# Patient Record
Sex: Male | Born: 1970 | Race: Black or African American | Hispanic: No | Marital: Single | Smoking: Never smoker
Health system: Southern US, Community
[De-identification: ages and names within clinical notes are randomized; demographics above are authoritative.]

## PROBLEM LIST (undated history)

## (undated) DIAGNOSIS — R55 Syncope and collapse: Principal | ICD-10-CM

---

## 2016-05-05 ENCOUNTER — Emergency Department (HOSPITAL_COMMUNITY): Payer: Self-pay

## 2016-05-05 ENCOUNTER — Emergency Department (HOSPITAL_COMMUNITY)
Admission: EM | Admit: 2016-05-05 | Discharge: 2016-05-06 | Disposition: A | Discharge status: Home / Self Care | Payer: Self-pay | Attending: Emergency Medicine | Admitting: Emergency Medicine

## 2016-05-05 ENCOUNTER — Encounter (HOSPITAL_COMMUNITY): Payer: Self-pay | Admitting: Nurse Practitioner

## 2016-05-05 DIAGNOSIS — J189 Pneumonia, unspecified organism: Secondary | ICD-10-CM | POA: Insufficient documentation

## 2016-05-05 DIAGNOSIS — R0781 Pleurodynia: Secondary | ICD-10-CM

## 2016-05-05 LAB — BASIC METABOLIC PANEL
Anion gap: 7 (ref 5–15)
BUN: 16 mg/dL (ref 6–20)
CALCIUM: 9.1 mg/dL (ref 8.9–10.3)
CO2: 30 mmol/L (ref 22–32)
CREATININE: 0.88 mg/dL (ref 0.61–1.24)
Chloride: 103 mmol/L (ref 101–111)
Glucose, Bld: 95 mg/dL (ref 65–99)
Potassium: 4.5 mmol/L (ref 3.5–5.1)
Sodium: 140 mmol/L (ref 135–145)

## 2016-05-05 LAB — CBC
HCT: 37.3 % — ABNORMAL LOW (ref 39.0–52.0)
Hemoglobin: 12.6 g/dL — ABNORMAL LOW (ref 13.0–17.0)
MCH: 31 pg (ref 26.0–34.0)
MCHC: 33.8 g/dL (ref 30.0–36.0)
MCV: 91.9 fL (ref 78.0–100.0)
PLATELETS: 199 10*3/uL (ref 150–400)
RBC: 4.06 MIL/uL — AB (ref 4.22–5.81)
RDW: 13.5 % (ref 11.5–15.5)
WBC: 3.5 10*3/uL — ABNORMAL LOW (ref 4.0–10.5)

## 2016-05-05 LAB — I-STAT TROPONIN, ED: TROPONIN I, POC: 0.01 ng/mL (ref 0.00–0.08)

## 2016-05-05 LAB — D-DIMER, QUANTITATIVE: D-Dimer, Quant: 0.53 ug/mL-FEU — ABNORMAL HIGH (ref 0.00–0.50)

## 2016-05-05 MED ORDER — OXYCODONE-ACETAMINOPHEN 5-325 MG PO TABS
1.0000 | ORAL_TABLET | Freq: Once | ORAL | Status: AC
  Administered 2016-05-05: 1 via ORAL
  Filled 2016-05-05: qty 1

## 2016-05-05 NOTE — ED Provider Notes (Signed)
[facility]-EMERGENCY DEPT Provider Note   CSN: [id number] Arrival date & time: 05/05/16  2019   By signing my name below, I, Christel MormonMatthew Jamison, attest that this documentation has been prepared under the direction and in the presence of Elpidio AnisShari Natalee Tomkiewicz, PA-C. Electronically Signed: Christel MormonMatthew Jamison, Scribe. 05/05/2016. 9:20 PM.   History   Chief Complaint Chief Complaint  Patient presents with  . Chest Pain    The history is provided by the patient. No language interpreter was used.   HPI Comments:  Tyler Leon is a 45 y.o. male who presents to the Emergency Department complaining of intermittent, nonradiating, left chest pain x1 week. Pt states that pain has been more constant for the last few days, prompting ED evaluation. Pt reports associated SOB, without cough or fever. The pain is sometimes worse with movement and deep breath.  He has mild abdominal pain that is not consistently present when the chest pain occurs. Pt reports PSHx of cardiac catheterization "years ago" with no stents placed. He believes his brother had "cardiac issues" but cannot say what he thinks they were.  Pt has taken tylenol with no relief. Pt denies cough, fever, nausea, vomiting, leg swelling.   History reviewed. No pertinent past medical history.  There are no active problems to display for this patient.   History reviewed. No pertinent surgical history.     Home Medications    Prior to Admission medications   Not on File    Family History History reviewed. No pertinent family history.  Social History Social History  Substance Use Topics  . Smoking status: Never Smoker  . Smokeless tobacco: Never Used  . Alcohol use Yes     Allergies   Dilantin [phenytoin]   Review of Systems Review of Systems  Constitutional: Negative for activity change, diaphoresis and fever.  HENT: Negative.   Respiratory: Positive for shortness of breath. Negative for cough.   Cardiovascular: Positive for chest  pain. Negative for leg swelling.  Gastrointestinal: Positive for abdominal pain. Negative for nausea and vomiting.  Musculoskeletal: Negative.   Skin: Negative.   Neurological: Negative for weakness.     Physical Exam Updated Vital Signs BP 130/89 (BP Location: Left Arm)   Pulse 66   Temp 98.3 F (36.8 C) (Oral)   Resp 20   SpO2 100%   Physical Exam  Constitutional: He appears well-developed and well-nourished. No distress.  HENT:  Head: Normocephalic and atraumatic.  Eyes: Conjunctivae are normal.  Cardiovascular: Normal rate.   No carotid bruits ; RRR, no murmurs,  Pulmonary/Chest: Effort normal and breath sounds normal. He exhibits no tenderness.  Abdominal: Soft. He exhibits no distension. There is no tenderness.  Musculoskeletal:  No edema  Neurological: He is alert.  Skin: Skin is warm and dry.  Psychiatric: He has a normal mood and affect.  Nursing note and vitals reviewed.    ED Treatments / Results  DIAGNOSTIC STUDIES:  Oxygen Saturation is 100% on RA, normal by my interpretation.    COORDINATION OF CARE:  9:20 PM Discussed treatment plan with pt at bedside and pt agreed to plan.   Labs (all labs ordered are listed, but only abnormal results are displayed) Labs Reviewed  BASIC METABOLIC PANEL  CBC  I-STAT TROPOININ, ED   Results for orders placed or performed during the hospital encounter of 05/05/16  Basic metabolic panel  Result Value Ref Range   Sodium 140 135 - 145 mmol/L   Potassium 4.5 3.5 - 5.1 mmol/L  Chloride 103 101 - 111 mmol/L   CO2 30 22 - 32 mmol/L   Glucose, Bld 95 65 - 99 mg/dL   BUN 16 6 - 20 mg/dL   Creatinine, Ser 1.61 0.61 - 1.24 mg/dL   Calcium 9.1 8.9 - 09.6 mg/dL   GFR calc non Af Amer >60 >60 mL/min   GFR calc Af Amer >60 >60 mL/min   Anion gap 7 5 - 15  CBC  Result Value Ref Range   WBC 3.5 (L) 4.0 - 10.5 K/uL   RBC 4.06 (L) 4.22 - 5.81 MIL/uL   Hemoglobin 12.6 (L) 13.0 - 17.0 g/dL   HCT 04.5 (L) 40.9 - 81.1 %     MCV 91.9 78.0 - 100.0 fL   MCH 31.0 26.0 - 34.0 pg   MCHC 33.8 30.0 - 36.0 g/dL   RDW 91.4 78.2 - 95.6 %   Platelets 199 150 - 400 K/uL  D-dimer, quantitative (not at [facility])  Result Value Ref Range   D-Dimer, Quant 0.53 (H) 0.00 - 0.50 ug/mL-FEU  I-stat troponin, ED  Result Value Ref Range   Troponin i, poc 0.01 0.00 - 0.08 ng/mL   Comment 3           Dg Chest 2 View  Result Date: 05/05/2016 CLINICAL DATA:  45 year old male with left-sided chest pain. EXAM: CHEST  2 VIEW COMPARISON:  None. FINDINGS: The heart size and mediastinal contours are within normal limits. Both lungs are clear. The visualized skeletal structures are unremarkable. IMPRESSION: No active cardiopulmonary disease. Electronically Signed   By: Elgie Collard M.D.   On: 05/05/2016 22:15   Ct Angio Chest Pe W/cm &/or Wo Cm  Result Date: 05/06/2016 CLINICAL DATA:  Acute onset of intermittent left-sided chest pain. Shortness of breath and mildly elevated D-dimer. Initial encounter. EXAM: CT ANGIOGRAPHY CHEST WITH CONTRAST TECHNIQUE: Multidetector CT imaging of the chest was performed using the standard protocol during bolus administration of intravenous contrast. Multiplanar CT image reconstructions and MIPs were obtained to evaluate the vascular anatomy. CONTRAST:  100 mL of Isovue 370 IV contrast COMPARISON:  Chest radiograph performed 05/05/2016 FINDINGS: Cardiovascular:  There is no evidence of pulmonary embolus. The heart is unremarkable in appearance. The thoracic aorta is grossly unremarkable. The great vessels are not well assessed due to the adjacent contrast bolus. Mediastinum/Nodes: The mediastinum is unremarkable in appearance. No mediastinal lymphadenopathy is seen. No pericardial effusion is identified. The thyroid gland is unremarkable. No axillary lymphadenopathy is appreciated. Lungs/Pleura: Mild left lower lobe opacity may reflect atelectasis or mild infection. No pleural effusion or pneumothorax is seen. No  masses are identified. Upper Abdomen: The visualized portions of the liver and spleen are grossly unremarkable. The visualized portions of the pancreas, adrenal glands and kidneys are within normal limits. Musculoskeletal: No acute osseous abnormalities are identified. The visualized musculature is unremarkable in appearance. Review of the MIP images confirms the above findings. IMPRESSION: 1. No evidence of pulmonary embolus. 2. Mild left lower lobe airspace opacity may reflect atelectasis or mild infection. Electronically Signed   By: Roanna Raider M.D.   On: 05/06/2016 00:40    EKG  EKG Interpretation None       Radiology No results found.  Procedures Procedures (including critical care time)  Medications Ordered in ED Medications - No data to display   Initial Impression / Assessment and Plan / ED Course  I have reviewed the triage vital signs and the nursing notes.  Pertinent labs & imaging results  that were available during my care of the patient were reviewed by me and considered in my medical decision making (see chart for details).  Clinical Course    Patient presents with left chest pain x 1 week, progressively worsening and associated with pleuritic type discomfort. He complains of some shortness of breath. Pain is not exertional. No fever.   The patient is seen by Dr. Madilyn Hook. No hypoxia or tachycardia, but d-dimer obtained secondary to pleuritic component of chest pain, and is found to be mildly elevated. CT angio is negative for PE but shows ?infection on left. Given pain is left sided, he has pleuritic pain and SOB, with other tests negative, will treat for early CAP with Zithromax. He is felt stable for discharge home.   Final Clinical Impressions(s) / ED Diagnoses   Final diagnoses:  None   1. Left chest pain 2. Early CAP  New Prescriptions New Prescriptions   No medications on file  I personally performed the services described in this documentation, which  was scribed in my presence. The recorded information has been reviewed and is accurate.      Elpidio Anis, PA-C 05/06/16 1610    Tilden Fossa, MD 05/07/16 [phone]

## 2016-05-05 NOTE — ED Notes (Signed)
No respiratory or acute distress noted alert and oriented x 3 call light in reach no reaction to medication noted. 

## 2016-05-05 NOTE — ED Triage Notes (Signed)
Pt c/o of generalized chest pain, onset a week ago, denies any associated symptoms.

## 2016-05-05 NOTE — ED Notes (Signed)
No respiratory or acute distress noted alert and oriented x 3 call light in reach. 

## 2016-05-06 ENCOUNTER — Encounter (HOSPITAL_COMMUNITY): Payer: Self-pay

## 2016-05-06 MED ORDER — AZITHROMYCIN 250 MG PO TABS
500.0000 mg | ORAL_TABLET | Freq: Once | ORAL | Status: AC
  Administered 2016-05-06: 500 mg via ORAL
  Filled 2016-05-06: qty 2

## 2016-05-06 MED ORDER — AZITHROMYCIN 250 MG PO TABS: 250.0000 mg | ORAL_TABLET | Freq: Every day | ORAL | 0 refills | Status: DC

## 2016-05-06 MED ORDER — KETOROLAC TROMETHAMINE 30 MG/ML IJ SOLN
30.0000 mg | Freq: Once | INTRAMUSCULAR | Status: AC
  Administered 2016-05-06: 30 mg via INTRAVENOUS
  Filled 2016-05-06: qty 1

## 2016-05-06 MED ORDER — NAPROXEN 500 MG PO TABS: 500.0000 mg | ORAL_TABLET | Freq: Two times a day (BID) | ORAL | 0 refills | Status: AC

## 2016-05-06 MED ORDER — IOPAMIDOL (ISOVUE-370) INJECTION 76%
100.0000 mL | Freq: Once | INTRAVENOUS | Status: AC | PRN
  Administered 2016-05-06: 100 mL via INTRAVENOUS

## 2016-05-06 NOTE — ED Notes (Signed)
No respiratory or acute distress noted alert and oriented x 3 call light in reach no reaction to medication noted. 

## 2016-05-15 ENCOUNTER — Ambulatory Visit: Payer: Self-pay | Attending: Critical Care Medicine | Admitting: Critical Care Medicine

## 2016-05-15 ENCOUNTER — Encounter: Payer: Self-pay | Admitting: Critical Care Medicine

## 2016-05-15 VITALS — BP 118/78 | HR 99 | Temp 98.3°F | Resp 18 | Ht 70.0 in | Wt 166.2 lb

## 2016-05-15 DIAGNOSIS — M940 Chondrocostal junction syndrome [Tietze]: Secondary | ICD-10-CM | POA: Insufficient documentation

## 2016-05-15 DIAGNOSIS — Z888 Allergy status to other drugs, medicaments and biological substances status: Secondary | ICD-10-CM | POA: Insufficient documentation

## 2016-05-15 NOTE — Progress Notes (Signed)
Subjective:    Patient ID: Tyler Leon, male    DOB: 1970-09-12, 45 y.o.   MRN: [id number]  Tyler Leon is a 45 y.o. male who presents to the Emergency Department complaining of intermittent, nonradiating, left chest pain x1 week. Pt states that pain has been more constant for the last few days, prompting ED evaluation  Pt with LLL CAP Rx Azithromycin   Since d/c from ED:  Pt noting more chest pain 9/10.  Pain is pressure like.  Non radiating.  No GERD symptoms     Pneumonia  He complains of chest tightness and shortness of breath. There is no cough, difficulty breathing, frequent throat clearing, hemoptysis, sputum production or wheezing. This is a chronic (chest pain chronic and recurrent for years) problem. The current episode started more than 1 year ago. The problem occurs constantly. The problem has been unchanged. Associated symptoms include chest pain. Pertinent negatives include no appetite change, dyspnea on exertion, ear congestion, ear pain, fever, headaches, heartburn, malaise/fatigue, myalgias, nasal congestion, orthopnea, PND, postnasal drip, rhinorrhea, sneezing, sore throat, trouble swallowing or weight loss. There is no history of pneumonia.    No past medical history on file.   No family history on file.   Social History   Social History  . Marital status: Single    Spouse name: N/A  . Number of children: N/A  . Years of education: N/A   Occupational History  . Not on file.   Social History Main Topics  . Smoking status: Never Smoker  . Smokeless tobacco: Never Used  . Alcohol use Yes  . Drug use: No  . Sexual activity: Not on file   Other Topics Concern  . Not on file   Social History Narrative  . No narrative on file     Allergies  Allergen Reactions  . Dilantin [Phenytoin]      Outpatient Medications Prior to Visit  Medication Sig Dispense Refill  . naproxen (NAPROSYN) 500 MG tablet Take 1 tablet (500 mg total) by mouth 2 (two)  times daily. (Patient not taking: Reported on 05/15/2016) 30 tablet 0  . azithromycin (ZITHROMAX Z-PAK) 250 MG tablet Take 1 tablet (250 mg total) by mouth daily. (Patient not taking: Reported on 05/15/2016) 4 tablet 0   No facility-administered medications prior to visit.      Review of Systems  Constitutional: Negative for appetite change, fatigue, fever, malaise/fatigue and weight loss.  HENT: Negative for ear pain, postnasal drip, rhinorrhea, sneezing, sore throat, trouble swallowing and voice change.   Respiratory: Positive for shortness of breath. Negative for cough, hemoptysis, sputum production and wheezing.   Cardiovascular: Positive for chest pain. Negative for dyspnea on exertion and PND.  Gastrointestinal: Negative.  Negative for heartburn.  Musculoskeletal: Negative for myalgias.  Neurological: Negative for headaches.       Objective:   Physical Exam Vitals:   05/15/16 1041  BP: 118/78  Pulse: 99  Resp: 18  Temp: 98.3 F (36.8 C)  TempSrc: Oral  SpO2: 98%  Weight: 166 lb 3.2 oz (75.4 kg)  Height: 5\' 10"  (1.778 m)    Gen: Pleasant, well-nourished, in no distress,  normal affect  ENT: No lesions,  mouth clear,  oropharynx clear, no postnasal drip  Neck: No JVD, no TMG, no carotid bruits  Lungs: No use of accessory muscles, no dullness to percussion, clear without rales or rhonchi Tenderness Left lateral sternum at costochondral junction  Cardiovascular: RRR, heart sounds normal, no murmur or  gallops, no peripheral edema  Abdomen: soft and NT, no HSM,  BS normal  Musculoskeletal: No deformities, no cyanosis or clubbing  Neuro: alert, non focal  Skin: Warm, no lesions or rashes  No results found. CT Chest 9/25: IMPRESSION: 1. No evidence of pulmonary embolus. 2. Mild left lower lobe airspace opacity may reflect atelectasis or mild infection.  ECG was normal     Assessment & Plan:  I personally reviewed [facility] and [facility] as  well as any outside material available during this office visit and agree with the  radiology impressions.   Costochondritis Costochondritis No evidence for PNA No further ABX needed Use of completion of naprosyn and f/u with prn ibuprofen encouraged No other Rx or work up indicated F/u with pcp   Tyler SeniorStephen was seen today for pneumonia.  Diagnoses and all orders for this visit:  Costochondritis

## 2016-05-15 NOTE — Patient Instructions (Addendum)
Resume Naprosyn 500mg  twice daily until gone, then can use ibuprofen 1-2 tablets, 3-4 times per day You do not have pneumonia You have inflammation , costochondritis  Return as needed  We will obtain a primary care MD for you  Costochondritis Costochondritis, sometimes called Tietze syndrome, is a swelling and irritation (inflammation) of the tissue (cartilage) that connects your ribs with your breastbone (sternum). It causes pain in the chest and rib area. Costochondritis usually goes away on its own over time. It can take up to 6 weeks or longer to get better, especially if you are unable to limit your activities. CAUSES  Some cases of costochondritis have no known cause. Possible causes include:  Injury (trauma).  Exercise or activity such as lifting.  Severe coughing. SIGNS AND SYMPTOMS  Pain and tenderness in the chest and rib area.  Pain that gets worse when coughing or taking deep breaths.  Pain that gets worse with specific movements. DIAGNOSIS  Your health care provider will do a physical exam and ask about your symptoms. Chest X-rays or other tests may be done to rule out other problems. TREATMENT  Costochondritis usually goes away on its own over time. Your health care provider may prescribe medicine to help relieve pain. HOME CARE INSTRUCTIONS   Avoid exhausting physical activity. Try not to strain your ribs during normal activity. This would include any activities using chest, abdominal, and side muscles, especially if heavy weights are used.  Apply ice to the affected area for the first 2 days after the pain begins.  Put ice in a plastic bag.  Place a towel between your skin and the bag.  Leave the ice on for 20 minutes, 2-3 times a day.  Only take over-the-counter or prescription medicines as directed by your health care provider. SEEK MEDICAL CARE IF:  You have redness or swelling at the rib joints. These are signs of infection.  Your pain does not go away  despite rest or medicine. SEEK IMMEDIATE MEDICAL CARE IF:   Your pain increases or you are very uncomfortable.  You have shortness of breath or difficulty breathing.  You cough up blood.  You have worse chest pains, sweating, or vomiting.  You have a fever or persistent symptoms for more than 2-3 days.  You have a fever and your symptoms suddenly get worse. MAKE SURE YOU:   Understand these instructions.  Will watch your condition.  Will get help right away if you are not doing well or get worse.   This information is not intended to replace advice given to you by your health care provider. Make sure you discuss any questions you have with your health care provider.   Document Released: 05/08/2005 Document Revised: 05/19/2013 Document Reviewed: 03/02/2013 Elsevier Interactive Patient Education [employer]2016 [employer] Inc[employer]

## 2016-05-15 NOTE — Progress Notes (Signed)
Patient is here for HFU for pneumonia  Patient complains of chest pain being present since sat. Patient states pain is described as tightening and increases at night.  Patient has taken medication today and patient has eaten today.

## 2016-05-16 NOTE — Assessment & Plan Note (Signed)
Costochondritis No evidence for PNA No further ABX needed Use of completion of naprosyn and f/u with prn ibuprofen encouraged No other Rx or work up indicated F/u with pcp

## 2017-10-22 IMAGING — CT CT ANGIO CHEST
2 of 6 series · 19 of 36 positions shown · IV contrast (ISOVUE 370)
Comparison: Chest radiograph performed 05/05/2016

CLINICAL DATA: Acute onset of intermittent left-sided chest pain.
Shortness of breath and mildly elevated D-dimer. Initial encounter.

EXAM:
CT ANGIOGRAPHY CHEST WITH CONTRAST
TECHNIQUE: Multidetector CT imaging of the chest was performed using the
standard protocol during bolus administration of intravenous
contrast. Multiplanar CT image reconstructions and MIPs were
obtained to evaluate the vascular anatomy.
CONTRAST:  100 mL of Isovue 370 IV contrast

[Series 5: thins for pacs · axial · 0.71mm/px · z∈[+1498,+1783]mm · 18 of 317 slices shown]
[im 16/317  lung]
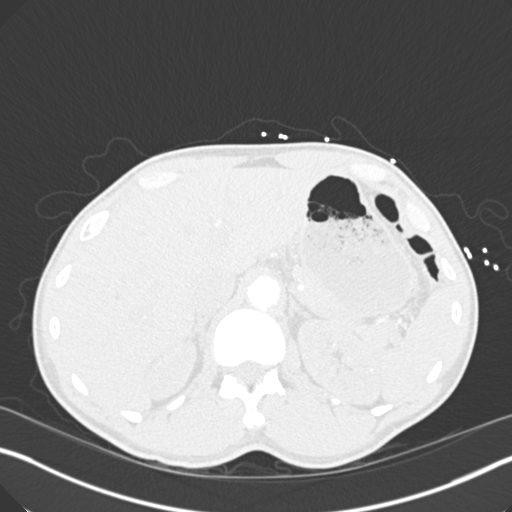
[im 32/317  mediastinal]
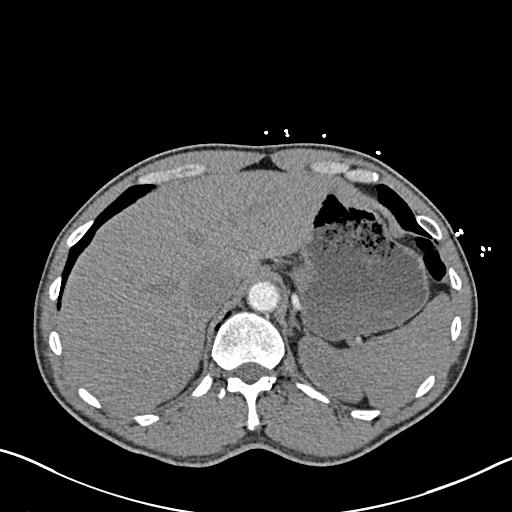
[im 48/317  lung]
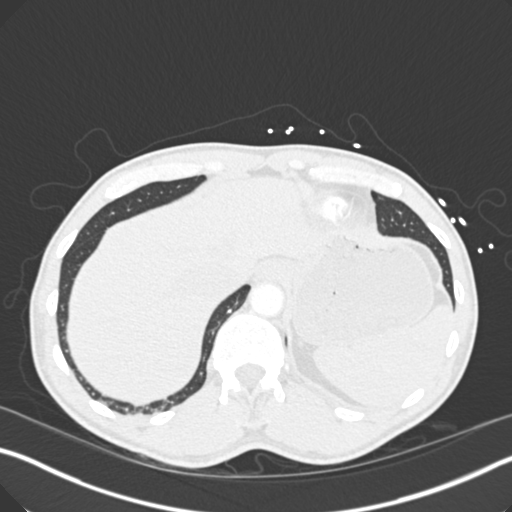
[im 64/317  mediastinal]
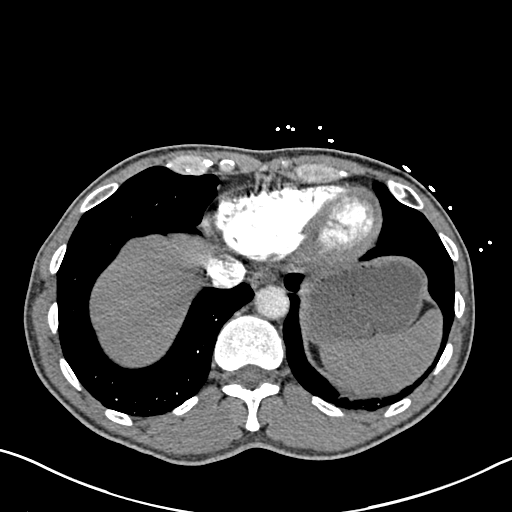
[im 80/317  lung]
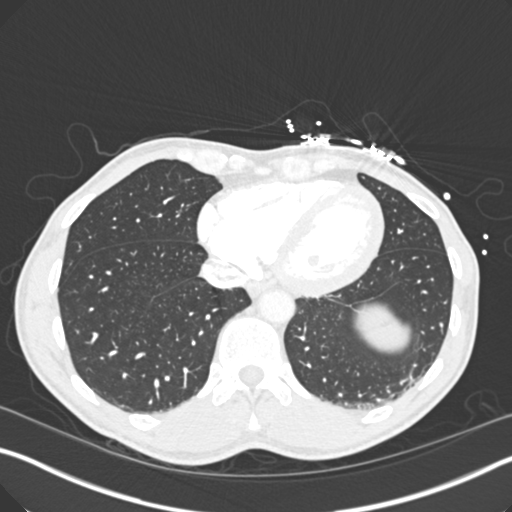
[im 95/317  mediastinal]
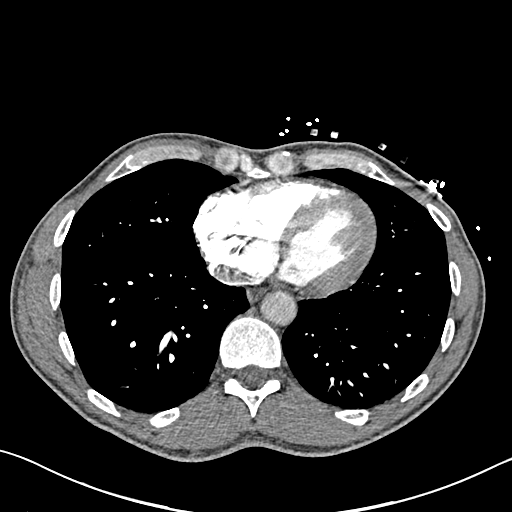
[im 111/317  lung]
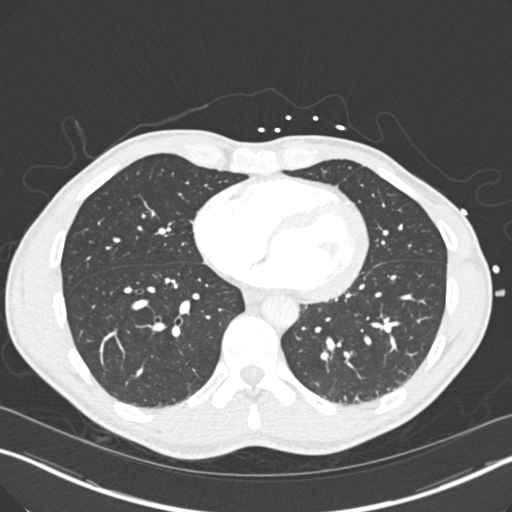
[im 127/317  mediastinal]
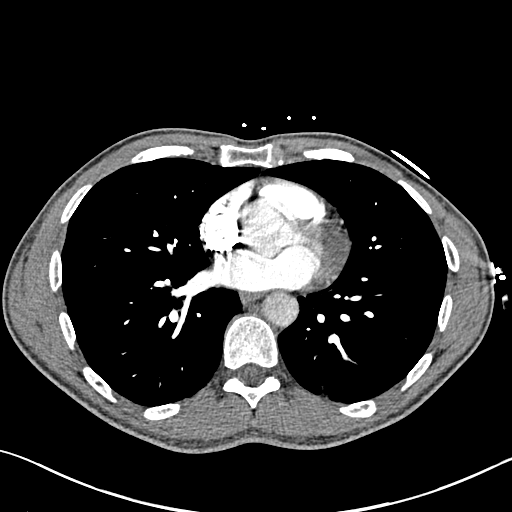
[im 143/317  lung]
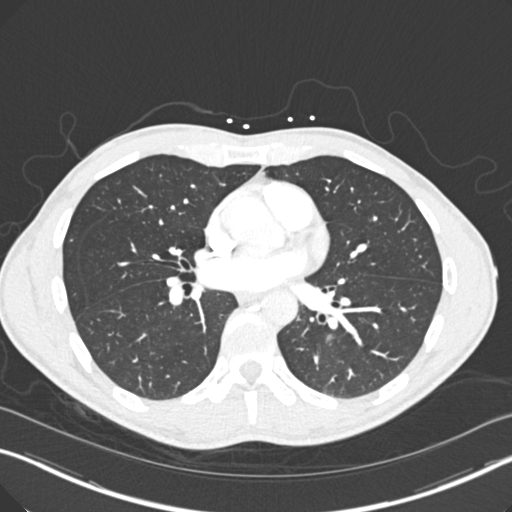
[im 174/317  mediastinal]
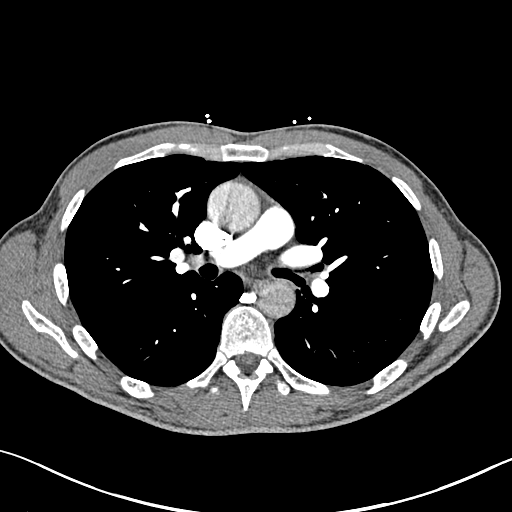
[im 190/317  lung]
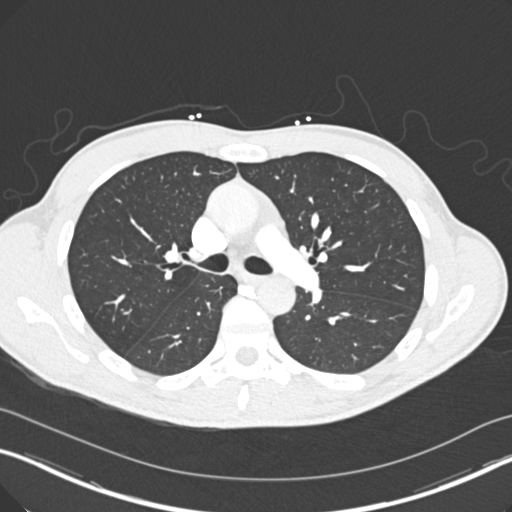
[im 206/317  mediastinal]
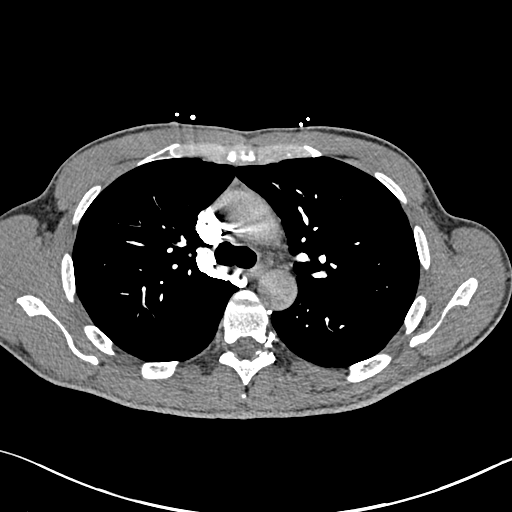
[im 222/317  lung]
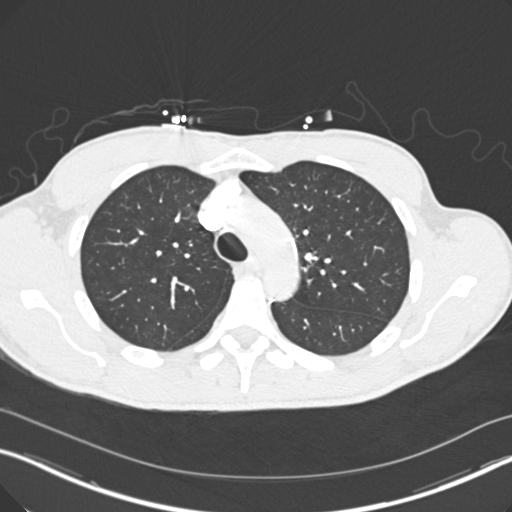
[im 238/317  mediastinal]
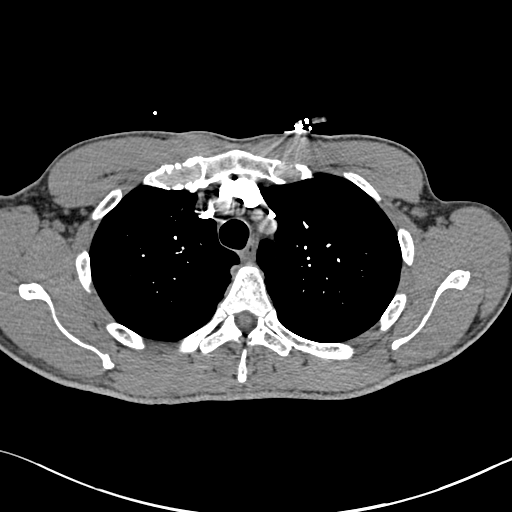
[im 253/317  lung]
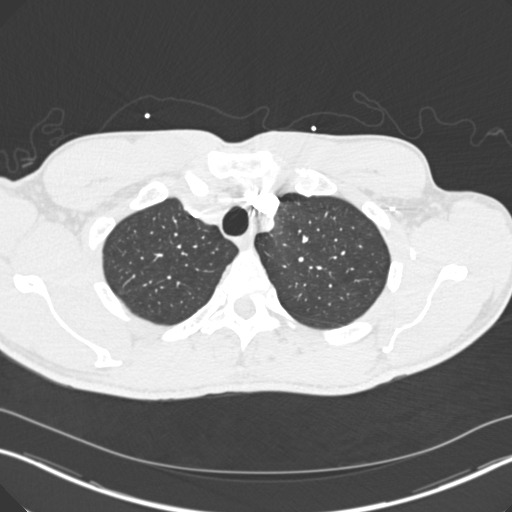
[im 269/317  mediastinal]
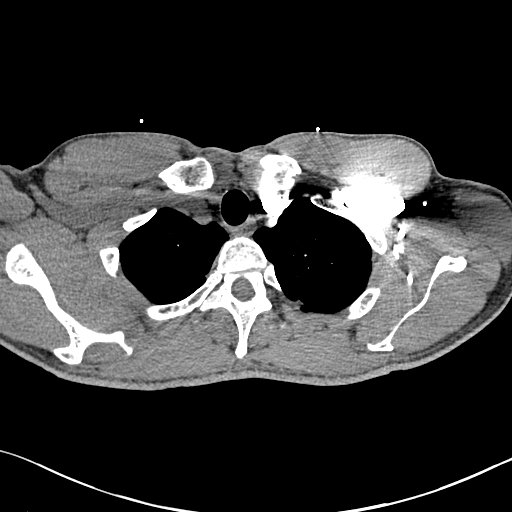
[im 285/317  lung]
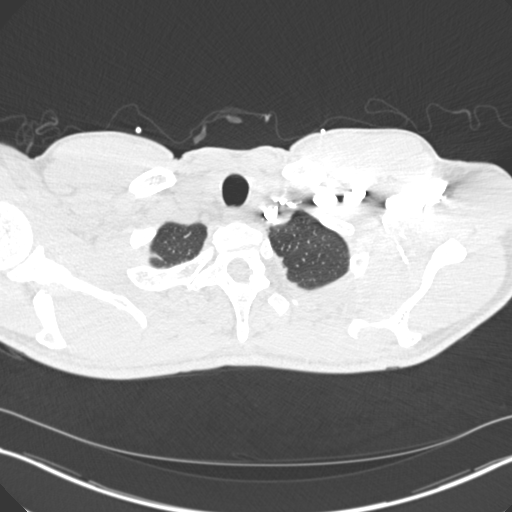
[im 301/317  mediastinal]
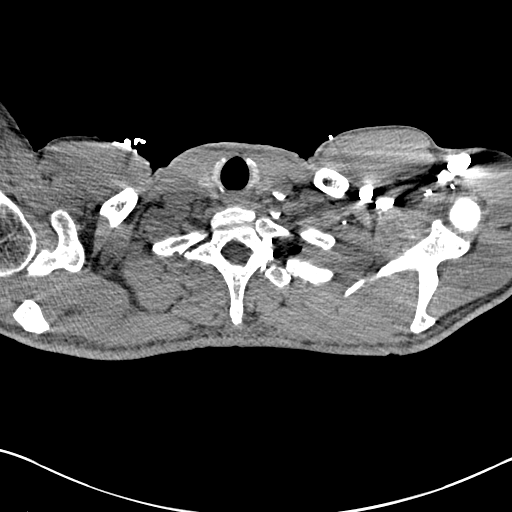

[Series 7: coronal mpr · coronal · 0.62mm/px · 1 of 108 slices shown]
[im 54/108  mediastinal]
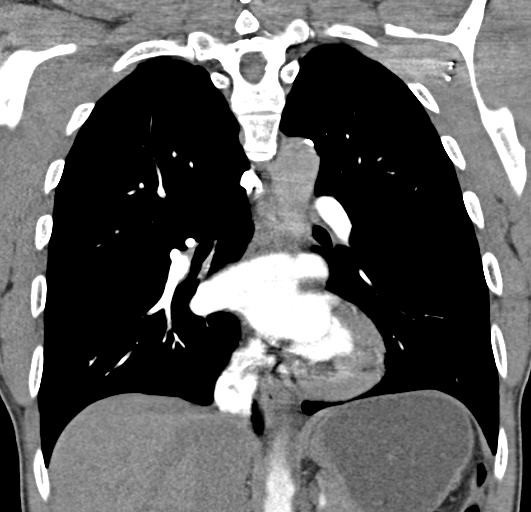

[19 of 36 positions shown; findings below may reference images not displayed]

FINDINGS: Cardiovascular:  There is no evidence of pulmonary embolus.

The heart is unremarkable in appearance. The thoracic aorta is
grossly unremarkable. The great vessels are not well assessed due to
the adjacent contrast bolus.

Mediastinum/Nodes: The mediastinum is unremarkable in appearance. No
mediastinal lymphadenopathy is seen. No pericardial effusion is
identified. The thyroid gland is unremarkable. No axillary
lymphadenopathy is appreciated.

Lungs/Pleura: Mild left lower lobe opacity may reflect atelectasis
or mild infection. No pleural effusion or pneumothorax is seen. No
masses are identified.

Upper Abdomen: The visualized portions of the liver and spleen are
grossly unremarkable. The visualized portions of the pancreas,
adrenal glands and kidneys are within normal limits.

Musculoskeletal: No acute osseous abnormalities are identified. The
visualized musculature is unremarkable in appearance.

Review of the MIP images confirms the above findings.
IMPRESSION: 1. No evidence of pulmonary embolus.
2. Mild left lower lobe airspace opacity may reflect atelectasis or
mild infection.

## 2021-07-13 NOTE — ED Provider Notes (Signed)
[facility] - EMERGENCY DEPARTMENT  EMERGENCY DEPARTMENT ENCOUNTER        PATIENT NAME: Luis Santiago  MRN: [id number]  DOB: [birth year]  DATE OF EVALUATION: 07/13/2021  ED PROVIDER: Aquilla Hacker, PA  PCP: No Pcp      CHIEF COMPLAINT       Chief Complaint   Patient presents with   . Neck Pain       HISTORY OF PRESENT ILLNESS   (Location/Symptom, Timing/Onset, Context/Setting, Quality, Duration, Modifying Factors,Severity)  Note limiting factors.     Luis Santiago is a 50 y.o. male with complaints of neck pain.  States that the bilateral base of the posterior neck worse on the left side.  Denies any numbness, tingling or weakness in his arms or legs.  He denies any injury or trauma.  He does drive a forklift for work.  He states that it started about 4 days ago and has increased.  He has been using warm towels on the area with no improvement.  He is also tried Motrin at home.    Nursing Notes were all reviewed and agreed with or any disagreements were addressed in the HPI.    REVIEW OF SYSTEMS    (2-9 systems for level 4, 10 or more for level 5)     GENERAL:     SKIN:    EYES:  No fevers, No chills, No recent weight change    No rash/lesions/wounds    No vision changes, No redness     ENMT:      No sore throat, No congestion, No ear pain   CARDIAC:    RESPIRATORY:    GI:       MSK:    NEURO:      PSYCH:   No chest pain, No palpitations     No shortness of breath, No cough, No dyspnea on exertion    No vomiting, no nausea, no diarrhea, no constipation, no abdominal pain    No joint pain    No headache, no change in mental status, No numbness, No tingling, No weakness    No SI/HI, No hallucinations      Positives and Pertinent negatives as per HPI.  Except as noted above in the ROS, all other systems were reviewed and negative or noncontributory.       PAST MEDICAL HISTORY   No past medical history on file.      SURGICAL HISTORY     No past surgical history on file.      CURRENT MEDICATIONS        Discharge Medication List as of 07/13/2021 11:36 PM          ALLERGIES     Dilantin [phenytoin sodium extended]    FAMILY HISTORY     No family history on file.     SOCIAL HISTORY       Social History     Socioeconomic History   . Marital status: Single     Spouse name: Not on file   . Number of children: Not on file   . Years of education: Not on file   . Highest education level: Not on file   Occupational History   . Not on file   Tobacco Use   . Smoking status: Never   . Smokeless tobacco: Never   Substance and Sexual Activity   . Alcohol use: Yes   . Drug use: Never   . Sexual activity: Not on file  Other Topics Concern   . Not on file   Social History Narrative   . Not on file     Social Determinants of Health     Financial Resource Strain: Not on file   Food Insecurity: Not on file   Transportation Needs: Not on file   Physical Activity: Not on file   Stress: Not on file   Social Connections: Not on file   Intimate Partner Violence: Not on file   Housing Stability: Not on file         PHYSICAL EXAM    (up to 7 for level 4, 8 or more for level 5)     Vitals:    07/13/21 2334   BP: 160/90   BP Location: Right arm   Patient Position: Sitting   Pulse: 75   Resp: 18   Temp: 97.8 F (36.6 C)   TempSrc: Oral   SpO2: 100%   Weight: 81.6 kg (180 lb)   Height: 177.8 cm (5\' 10" )         GENERAL:   SKIN: Alert, cooperative, no acute distress, non-toxic  No rashes, lesions, wounds to exposed skin.  Cap refill normal. Warm, dry         HEAD:  Normocephalic, without obvious abnormality, atraumatic   EYES:    PERRLA, conjunctiva/corneas clear, EOMI.  Sclera anicteric   ENT:   Mucous membranes moist.  No stridor.  Tolerating secretions with no difficulty swallowing.         LUNGS:   Lungs clear to auscultation bilaterally with no rhonchi, wheezes or crackles.  No increased work of breathing.  Symmetric expansion.  Equal lung sounds bilaterally.   CHEST WALL:  Nontender, no deformity, symmetric   HEART:  Regular rate and  rhythm, normal S1-S2.  No murmurs gallops or rubs.  No edema.  Peripheral pulses intact and equal.   ABDOMEN:   Soft, nontender, nondistended.  No evidence of mass, guarding, rigidity or rebound.  No organomegaly.  Normoactive bowel sounds in all quadrants.   EXTREMITIES: Full range of motion of extremities with no obvious swelling, deformity or tenderness.   BACK:  No midline tenderness along cervical or thoracic spine.  No step-off or deformity.  Patient is tender at the base of bilateral trapezius muscles.  No pain in the anterior neck.  Patient is able to flex, extend, laterally flex and rotate at the neck states some movements do exacerbate pain and make it feel stiff.  He is able to shrug her shoulders, squeeze shoulder blades together and raise his arms above his head without significant difficulty.  He has normal and equal strength, sensation and reflexes of bilateral upper extremities.       NEUROLOGIC:  PSYCH:    Alert and oriented X 4.  CN 2-12 intact.  No focal deficits.  Cooperative, appropriate mood and affect.      MEDICAL DECISION-MAKING     Vitals:    07/13/21 2334   BP: 160/90   BP Location: Right arm   Patient Position: Sitting   Pulse: 75   Resp: 18   Temp: 97.8 F (36.6 C)   TempSrc: Oral   SpO2: 100%   Weight: 81.6 kg (180 lb)   Height: 177.8 cm (5\' 10" )       Patient was given the following medications:  Medications   ketorolac (TORADOL) injection 30 mg (30 mg intramuscular Given 07/13/21 2340)   dexamethasone (DECADRON) 10 mg/mL oral solution 10 mg (10  mg oral Given 07/13/21 2340)       Patient here with complaints of neck pain.  Describes it is slightly worse on the left as compared to the right but has tenderness in bilateral trapezius muscles at the more inferior aspect.  He has intact range of motion and is neurologically intact as well.  He has no midline tenderness I do not feel imaging is indicated at this time.  Patient was given Decadron and Toradol.  He is discharged home with  prescription for Flexeril and encouraged use Motrin and Tylenol at home as needed for pain control.  Encourage stretching, massage and heat as well.  If not improving over the week with this and medications, he is to follow-up with PCP.  He is otherwise to return to ED if worsening.  He states understanding of plan, is agreeable and is discharged home in stable condition.    The patient and / or the family were informed of the results of any tests, a time was given to answer questions.    FINAL IMPRESSION      1. Trapezius muscle strain, unspecified laterality, initial encounter        DISPOSITION / PLAN   Discharge home    PATIENT REFERRED TO:  PCP    In 3 days  As needed      DISCHARGE MEDICATIONS:  Discharge Medication List as of 07/13/2021 11:36 PM      START taking these medications    Details   cyclobenzaprine (FLEXERIL) 10 mg tablet Take 1 tablet (10 mg total) by mouth 3 (three) times a day as needed for muscle spasms for up to 3 days., Starting Fri 07/13/2021, Until Mon 07/16/2021 at 2359, Normal             DISCONTINUED MEDICATIONS:  Discharge Medication List as of 07/13/2021 11:36 PM            Aquilla Hacker, PA (electronically signed)      (Please note that portions of this note were completed with a voice recognition program.  Efforts were made to edit the dictations but occasionally words are mis-transcribed.)    This care is provided during extreme circumstances caused in large part by the current COVID-19 pandemic.  This has placed heavy strains on healthcare resources, including ED and hospital operations.  We continue under a declaration of a [employer] Emergency at this time.  As this pandemic continues to evolve, the Hospital, Physicians, Advanced Practice Providers and other hospital staff strive to respond fluidly, remain operational and provide care relative to available resources and information.  Further, the impact of COVID-19 on all aspects of emergency and hospital care,  including the impact to patients seeking care for reasons other than COVID-19, is unavoidable during this National Emergency.

## 2021-08-04 DIAGNOSIS — S161XXA Strain of muscle, fascia and tendon at neck level, initial encounter: Secondary | ICD-10-CM

## 2021-08-04 NOTE — Discharge Instructions (Addendum)
You were evaluated in the emergency department today for left-sided neck pain    Physical exam is consistent with muscle strain and spasm     you were given a shot of Toradol here in the emergency department    I have written you prescription for Robaxin.  This is a muscle relaxer.  Take caution while taking this muscle relaxer as sometimes it can cause people to be groggy, foggy, sleepy, increased risk of falls.  Do not drink alcohol on this medication or take other sedatives on this medication    I have written your prescription for Mobic.  This is a NSAID.  Do not take ibuprofen, Motrin, Aleve while taking this medication.  Recommend taking this medication with    Contact your primary care provider to schedule follow-up within the next 1 to 2 weeks.  Recommend that you discuss with your primary care provider possible need for physical therapy as this neck pain has been persistent    Return to this emergency department or another emergency department if you have numbness or weakness in your upper extremities, chest pain, shortness of breath, signs or symptoms of stroke is listed in your discharge paperwork

## 2021-08-04 NOTE — ED Triage Notes (Signed)
Pt arrives ambulatory to ED. Pt c/o LEFT sided neck pain. Pt states was seen 2 weeks ago for same and diagnosed with a "muscle  strain". Pt states no relief with OTC medication, or stretches he was told to do.   Pt reports pain is intermittent in nature but sharp when occurring. Mainly occurring when pt moves head side to side.

## 2021-08-04 NOTE — ED Notes (Signed)
I have reviewed discharge instructions with the patient.  The patient verbalized understanding.    Patient left ED via Discharge Method: ambulatory to Home     Opportunity for questions and clarification provided. Pt BP 167 systolic, pt reports hx of HTN and was prescribed medication for it but is now noncompliant. RN encouraged pt to follow up w/ primary care      Patient given 2 scripts.          Dorise Hiss Zailah Zagami, RN  08/04/21 [phone]

## 2021-08-04 NOTE — ED Provider Notes (Signed)
Emergency Department Provider Note                   PCP:                None Provider               Age: 50 y.o.      Sex: male       ICD-10-CM    1. Strain of neck muscle, initial encounter  S16.1XXA meloxicam (MOBIC) 7.5 MG tablet      2. Trapezius muscle spasm  M62.838 meloxicam (MOBIC) 7.5 MG tablet     methocarbamol (ROBAXIN-750) 750 MG tablet          DISPOSITION Decision To Discharge 08/04/2021 10:37:20 PM        MDM  Number of Diagnoses or Management Options  Strain of neck muscle, initial encounter  Trapezius muscle spasm  Diagnosis management comments: Vital signs reviewed, patient stable, NAD, afebrile, nontoxic in appearance     Based on history, physical exam, I do not feel any imaging or any lab work is warranted at this time.    Patient has some spasming of his left trapezius muscle.  No nuchal rigidity.  Patient is neurovascularly intact.  No midline tenderness to palpation along C-spine or T-spine, no bony crepitus, no step-off.    Physical exam is consistent with muscle spasm and likely strain.  We will treat the emergency department with an IM injection of Toradol.  Patient states that he drove here and does not have a ride home.   will write him a prescription for Robaxin.  Regarding Robaxin given verbally and in discharge paperwork.  We will write patient a prescription for Mobic.    I discussed physical exam findings, treatment, follow-up with patient.  Answered any questions that he had.  Informed patient that since he does have a primary care provider and that he has been having this waxing waning strain and spasming, he may benefit from physical therapy.  Patient states that he is amenable to this and will discuss with his primary care provider back home.    I discussed signs and symptoms that would warrant a prompt return to the emergency department.  I included these in discharge paperwork as well.    Patient discharged home in stable condition.  He is to follow-up with his primary  care provider.  Return to ED precautions reiterated.       Amount and/or Complexity of Data Reviewed  Review and summarize past medical records: yes    Risk of Complications, Morbidity, and/or Mortality  Presenting problems: low  Diagnostic procedures: low  Management options: low    Patient Progress  Patient progress: stable                              No orders of the defined types were placed in this encounter.       Medications   ketorolac (TORADOL) injection 30 mg (30 mg IntraMUSCular Given 08/04/21 2241)       Discharge Medication List as of 08/04/2021 10:55 PM        START taking these medications    Details   meloxicam (MOBIC) 7.5 MG tablet Take 1 tablet by mouth daily for 7 days, Disp-7 tablet, R-0Print      methocarbamol (ROBAXIN-750) 750 MG tablet Take 1 tablet by mouth 4 times daily for 10 days, Disp-40 tablet, R-0Print  Arvo Ealy is a 50 y.o. male who presents to the Emergency Department with chief complaint of    Chief Complaint   Patient presents with    Neck Pain      50 year old male with history of hypertension, BPH, costochondritis, dyslipidemia presents to the emergency department today with chief complaint of left side neck pain for the past 2 to 3 days.  Patient states initially he had the same side left-sided neck pain12/2/22 and was evaluated in emergency department with [facility].  Patient states that they gave him a prescription for Flexeril and pain did improve however pain is come back.  Patient states he has been trying heat without relief.  States that when he turns his head pain is made worse.  Patient denies any upper extremity paresthesias, upper extremity weakness, midline neck pain, headache, chest pain, shortness of breath, changes to vision or speech, no other complaints.  Patient states he is visiting from out of town and does have a primary care provider back at home.    The history is provided by the patient. No language interpreter was used.       Review  of Systems   Constitutional:  Negative for chills and fever.   Respiratory:  Negative for shortness of breath.    Cardiovascular:  Negative for chest pain.   Gastrointestinal:  Negative for abdominal pain, diarrhea, nausea and vomiting.   Musculoskeletal:  Positive for neck pain. Negative for neck stiffness.   Neurological:  Negative for weakness, numbness and headaches.   All other systems reviewed and are negative.    Past Medical History:   Diagnosis Date    Hypertension         History reviewed. No pertinent surgical history.     History reviewed. No pertinent family history.     Social History     Socioeconomic History    Marital status: Single     Spouse name: None    Number of children: None    Years of education: None    Highest education level: None   Tobacco Use    Smoking status: Never    Smokeless tobacco: Never   Vaping Use    Vaping Use: Never used   Substance and Sexual Activity    Drug use: Never         Dilantin [phenytoin]     Discharge Medication List as of 08/04/2021 10:55 PM           Vitals signs and nursing note reviewed.   Patient Vitals for the past 4 hrs:   Temp Pulse Resp BP SpO2   08/04/21 2300 -- -- -- (!) 167/112 --   08/04/21 2146 98.5 ??F (36.9 ??C) 77 18 (!) 164/108 99 %          Physical Exam  Vitals and nursing note reviewed.   Constitutional:       General: He is not in acute distress.     Appearance: Normal appearance. He is normal weight. He is not ill-appearing, toxic-appearing or diaphoretic.   HENT:      Head: Normocephalic and atraumatic.      Mouth/Throat:      Mouth: Mucous membranes are moist.      Pharynx: Oropharynx is clear.   Eyes:      General: No scleral icterus.     Extraocular Movements: Extraocular movements intact.      Conjunctiva/sclera: Conjunctivae normal.      Pupils: Pupils are equal, round,  and reactive to light.   Neck:      Vascular: Normal carotid pulses.      Trachea: Trachea and phonation normal. No abnormal tracheal secretions.        Comments: No  nuchal rigidity  Cardiovascular:      Rate and Rhythm: Normal rate.      Pulses: Normal pulses.      Heart sounds: Normal heart sounds.      Comments: Bilateral carotid and radial pulses 2+ and equal  Pulmonary:      Effort: Pulmonary effort is normal.      Breath sounds: Normal breath sounds.   Abdominal:      General: Bowel sounds are normal.   Musculoskeletal:         General: Normal range of motion.      Cervical back: Normal range of motion and neck supple. Spasms (Moderate spasming left trapezius muscle, proximal head) and tenderness (Tenderness over trapezius muscle on left side) present. No rigidity, torticollis, bony tenderness or crepitus. Pain with movement (Left-sided neck pain with turning head to the left or right) and muscular tenderness present. No spinous process tenderness. Normal range of motion.      Thoracic back: Normal.      Lumbar back: Normal.        Back:    Lymphadenopathy:      Cervical: No cervical adenopathy.   Skin:     General: Skin is warm and dry.      Capillary Refill: Capillary refill takes less than 2 seconds.   Neurological:      General: No focal deficit present.      Mental Status: He is alert and oriented to person, place, and time.      Sensory: No sensory deficit (Bilateral upper extremity sensation intact and equal).      Motor: No weakness (Lateral upper extremity motor strength 5+ and equal).   Psychiatric:         Mood and Affect: Mood normal.         Behavior: Behavior normal.         Thought Content: Thought content normal.         Judgment: Judgment normal.        Procedures    No results found for any visits on 08/04/21.     No orders to display                       Voice dictation software was used during the making of this note.  This software is not perfect and grammatical and other typographical errors may be present.  This note has not been completely proofread for errors.      Shearon Stalls, [place]  08/04/21 [phone]

## 2021-08-05 ENCOUNTER — Inpatient Hospital Stay
Admit: 2021-08-05 | Discharge: 2021-08-05 | Disposition: A | Discharge status: Home / Self Care | Payer: PRIVATE HEALTH INSURANCE | Attending: Student in an Organized Health Care Education/Training Program

## 2021-08-05 MED ORDER — MELOXICAM 7.5 MG PO TABS
7.5 MG | ORAL_TABLET | Freq: Every day | ORAL | 0 refills | Status: AC
Start: 2021-08-05 — End: 2021-08-11

## 2021-08-05 MED ORDER — METHOCARBAMOL 750 MG PO TABS
750 MG | ORAL_TABLET | Freq: Four times a day (QID) | ORAL | 0 refills | Status: AC
Start: 2021-08-05 — End: 2021-08-14

## 2021-08-05 MED ORDER — KETOROLAC TROMETHAMINE 30 MG/ML IJ SOLN
30 MG/ML | Freq: Once | INTRAMUSCULAR | Status: AC
Start: 2021-08-05 — End: 2021-08-04
  Administered 2021-08-05: 04:00:00 30 mg via INTRAMUSCULAR

## 2021-08-05 MED FILL — KETOROLAC TROMETHAMINE 30 MG/ML IJ SOLN: 30 MG/ML | INTRAMUSCULAR | Qty: 1

## 2022-04-06 ENCOUNTER — Inpatient Hospital Stay
Admit: 2022-04-06 | Discharge: 2022-04-06 | Disposition: A | Discharge status: Home / Self Care | Payer: PRIVATE HEALTH INSURANCE | Attending: Emergency Medicine

## 2022-04-06 DIAGNOSIS — K08409 Partial loss of teeth, unspecified cause, unspecified class: Secondary | ICD-10-CM

## 2022-04-06 MED ORDER — HYDROCODONE-ACETAMINOPHEN 5-325 MG PO TABS
5-325 MG | ORAL_TABLET | Freq: Four times a day (QID) | ORAL | 0 refills | Status: AC | PRN
Start: 2022-04-06 — End: 2022-04-09

## 2022-04-06 MED ORDER — AMOXICILLIN-POT CLAVULANATE 875-125 MG PO TABS
875-125 MG | ORAL_TABLET | Freq: Two times a day (BID) | ORAL | 0 refills | Status: AC
Start: 2022-04-06 — End: 2022-04-13

## 2022-04-06 MED ORDER — MAGIC MOUTHWASH WITHOUT NYSTATIN SIMPLE
Freq: Once | Status: AC
Start: 2022-04-06 — End: 2022-04-06
  Administered 2022-04-06: 13:00:00 5 mL via ORAL

## 2022-04-06 MED ORDER — HYDROCODONE-ACETAMINOPHEN 5-325 MG PO TABS
5-325 MG | Freq: Once | ORAL | Status: AC
Start: 2022-04-06 — End: 2022-04-06
  Administered 2022-04-06: 13:00:00 1 via ORAL

## 2022-04-06 MED FILL — MAGIC MOUTHWASH WITHOUT NYSTATIN SIMPLE: Qty: 90

## 2022-04-06 MED FILL — HYDROCODONE-ACETAMINOPHEN 5-325 MG PO TABS: 5-325 MG | ORAL | Qty: 1

## 2022-04-06 NOTE — Discharge Instructions (Addendum)
Swish magic mouth , let sit in right back corner of mouth. Perform this 4 times daily. Take other medications as prescribed. Follow up with your dentist.

## 2022-04-06 NOTE — ED Provider Notes (Signed)
[facility]    CHIEF COMPLAINT       Chief Complaint   Patient presents with    Dental Pain       Nurses Notes reviewed and I agree except as noted in the HPI.    HISTORY OF PRESENT ILLNESS   Luis Santiago is a 51 y.o. male who presents to the ED for evaluation of dental pain.  Patient reports having a tooth extraction approximately 2 days ago.  He notes increased pain to the right lower jawline, radiating down his neck.  He denies trismus.  Denies fevers or chills.  He denies being on any antibiotics currently.  He notes that he has had several procedures on this tooth recently.  He notes that he is currently taking Tylenol 3 for pain with no relief.  He notes a past medical history of hypertension.  Denies any other significant medical problems.        HPI was provided by the patient.         PAST MEDICAL HISTORY     Past Medical History:   Diagnosis Date    Hypertension        SURGICALHISTORY      has no past surgical history on file.    CURRENT MEDICATIONS       Discharge Medication List as of 04/06/2022  9:02 AM          ALLERGIES     is allergic to dilantin [phenytoin].    FAMILY HISTORY     has no family status information on file.    family history is not on file.    SOCIAL HISTORY       Social History     Socioeconomic History    Marital status: Single     Spouse name: Not on file    Number of children: Not on file    Years of education: Not on file    Highest education level: Not on file   Occupational History    Not on file   Tobacco Use    Smoking status: Never    Smokeless tobacco: Never   Vaping Use    Vaping Use: Never used   Substance and Sexual Activity    Alcohol use: Not on file    Drug use: Never    Sexual activity: Not on file   Other Topics Concern    Not on file   Social History Narrative    Not on file     Social Determinants of Health     Financial Resource Strain: Not on file   Food Insecurity: Not on file   Transportation Needs: Not on file   Physical Activity: Not on  file   Stress: Not on file   Social Connections: Not on file   Intimate Partner Violence: Not on file   Housing Stability: Not on file       PHYSICAL EXAM     INITIAL VITALS:  oral temperature is 98.1 F (36.7 C). His blood pressure is 147/108 (abnormal) and his pulse is 84.    Physical Exam  Vitals and nursing note reviewed.   Constitutional:       Appearance: Normal appearance. He is well-developed. He is not ill-appearing.   HENT:      Head: Normocephalic.      Jaw: Tenderness and pain on movement present. No trismus or swelling.      Right Ear: External ear normal.  Left Ear: External ear normal.      Nose: Nose normal.      Mouth/Throat:      Mouth: Mucous membranes are moist.      Dentition: Abnormal dentition. No dental tenderness, gingival swelling, dental caries, dental abscesses or gum lesions.      Tongue: No lesions.      Pharynx: Uvula midline.   Eyes:      Conjunctiva/sclera: Conjunctivae normal.   Cardiovascular:      Rate and Rhythm: Normal rate and regular rhythm.      Heart sounds: Normal heart sounds, S1 normal and S2 normal.   Pulmonary:      Effort: Pulmonary effort is normal. No respiratory distress.      Breath sounds: Normal breath sounds.   Chest:      Chest wall: No tenderness.   Abdominal:      General: Bowel sounds are normal. There is no distension.      Palpations: Abdomen is soft.      Tenderness: There is no abdominal tenderness.   Musculoskeletal:         General: Normal range of motion.      Cervical back: Normal range of motion and neck supple.   Skin:     General: Skin is warm and dry.      Coloration: Skin is not pale.      Findings: No erythema or rash.   Neurological:      Mental Status: He is alert and oriented to person, place, and time.   Psychiatric:         Behavior: Behavior normal.         Thought Content: Thought content normal.         Judgment: Judgment normal.       DIFFERENTIAL DIAGNOSIS:   Postoperative pain, dental abscess, dry socket    DIAGNOSTIC RESULTS          RADIOLOGY: non-plainfilm images(s) such as CT, Ultrasound and MRI are read by the radiologist.  Plain radiographic images are visualized and preliminarily interpreted by the emergency physician unless otherwise stated below.  No orders to display       LABS:   Labs Reviewed - No data to display    EMERGENCY DEPARTMENT COURSE:   Vitals:    Vitals:    04/06/22 0748 04/06/22 0750   BP:  (!) 147/108   Pulse: 84    Temp: 98.1 F (36.7 C)    TempSrc: Oral          MDM    Patient was seen and evaluated in the emergency department, patient appeared to be in no acute distress, vital signs reviewed, hypertension noted.  Physical exam was completed, he has noted tooth extraction to the right molar, he had some cottonball packing at the time my evaluation.  There is no significant swelling to the gumline.  There was no trismus.  There is no swelling down the jawline.  Patient was treated with medications below, I do not feel he requires further work-up at this time.  He is advised to follow-up with his dentist on Monday if pain continues.  He verbalized understanding of plan of care.  Medications   magic (miracle) mouthwash (5 mLs Swish & Spit Given 04/06/22 0855)   HYDROcodone-acetaminophen (NORCO) 5-325 MG per tablet 1 tablet (1 tablet Oral Given 04/06/22 0855)       Patient was seen independently by myself. The patient's final impression and disposition and plan  was determined by myself.     CRITICAL CARE:   None    CONSULTS:  None    PROCEDURES:  None    FINAL IMPRESSION     1. Status post tooth extraction    2. Pain following oral surgery          DISPOSITION/PLAN   Discharged  PATIENT REFERREDTO:  [facility]    Call in 2 days  If symptoms worsen      DISCHARGE MEDICATIONS:  Discharge Medication List as of 04/06/2022  9:02 AM        START taking these medications    Details   HYDROcodone-acetaminophen (NORCO) 5-325 MG per tablet Take 1 tablet by mouth every 6 hours as needed for Pain for up to 3 days. Intended  supply: 3 days. Take lowest dose possible to manage pain Max Daily Amount: 4 tablets, Disp-10 tablet, R-0Normal      amoxicillin-clavulanate (AUGMENTIN) 875-125 MG per tablet Take 1 tablet by mouth 2 times daily for 7 days, Disp-14 tablet, R-0Normal             (Please note that portions of this note were completed with a voice recognition program.  Efforts were made to edit the dictations but occasionally words are mis-transcribed.)      Provider:  I personally performed the services described in the documentation,reviewed and edited the documentation which was dictated to the scribe in my presence, and it accurately records my words and actions.    Choice Kleinsasser, CNP 04/06/22 5:14 PM    Baird Lyons Ahmoni Edge, APRN - CNP         [employer], APRN - CNP  04/06/22 1719

## 2022-04-06 NOTE — ED Triage Notes (Signed)
Patient presents to ED with c/o R sided jaw and neck pain. States that he had multiple teeth pulled on the R side two days ago. States that they gave him tylenol #3 for pain and his last dose was at 0100 this morning.

## 2022-04-26 ENCOUNTER — Inpatient Hospital Stay
Admit: 2022-04-26 | Discharge: 2022-04-26 | Disposition: A | Discharge status: Home / Self Care | Payer: PRIVATE HEALTH INSURANCE | Attending: Chiropractor

## 2022-04-26 ENCOUNTER — Emergency Department: Admit: 2022-04-26 | Payer: PRIVATE HEALTH INSURANCE

## 2022-04-26 DIAGNOSIS — I10 Essential (primary) hypertension: Secondary | ICD-10-CM

## 2022-04-26 DIAGNOSIS — G44209 Tension-type headache, unspecified, not intractable: Secondary | ICD-10-CM

## 2022-04-26 LAB — CBC WITH AUTO DIFFERENTIAL
Basophils Absolute: 0 10*3/uL (ref 0.0–0.1)
Basophils: 0.4 %
Eosinophils Absolute: 0.1 10*3/uL (ref 0.0–0.4)
Eosinophils: 2.9 %
Hematocrit: 36.7 % — ABNORMAL LOW (ref 42.0–52.0)
Hemoglobin: 11.8 gm/dl — ABNORMAL LOW (ref 14.0–18.0)
Immature Grans (Abs): 0 10*3/uL (ref 0.00–0.07)
Immature Granulocytes: 0 %
Lymphocytes Absolute: 1.2 10*3/uL (ref 1.0–4.8)
Lymphocytes: 42.8 %
MCH: 31 pg (ref 26.0–33.0)
MCHC: 32.2 gm/dl (ref 32.2–35.5)
MCV: 96.3 fL — ABNORMAL HIGH (ref 80.0–94.0)
MPV: 9.7 fL (ref 9.4–12.4)
Monocytes Absolute: 0.4 10*3/uL (ref 0.4–1.3)
Monocytes: 12.9 %
Platelet Estimate: ADEQUATE
Platelets: 242 10*3/uL (ref 130–400)
RBC: 3.81 10*6/uL — ABNORMAL LOW (ref 4.70–6.10)
RDW-CV: 13.2 % (ref 11.5–14.5)
RDW-SD: 47.3 fL — ABNORMAL HIGH (ref 35.0–45.0)
Seg Neutrophils: 41 %
Segs Absolute: 1.1 10*3/uL — ABNORMAL LOW (ref 1.8–7.7)
WBC: 2.8 10*3/uL — ABNORMAL LOW (ref 4.8–10.8)
nRBC: 0 /100 wbc

## 2022-04-26 LAB — COMPREHENSIVE METABOLIC PANEL W/ REFLEX TO MG FOR LOW K
ALT: 11 U/L (ref 11–66)
AST: 15 U/L (ref 5–40)
Albumin: 4.2 g/dL (ref 3.5–5.1)
Alkaline Phosphatase: 62 U/L (ref 38–126)
BUN: 12 mg/dL (ref 7–22)
CO2: 26 meq/L (ref 23–33)
Calcium: 8.9 mg/dL (ref 8.5–10.5)
Chloride: 104 meq/L (ref 98–111)
Creatinine: 0.7 mg/dL (ref 0.4–1.2)
Glucose: 87 mg/dL (ref 70–108)
Potassium reflex Magnesium: 4.6 meq/L (ref 3.5–5.2)
Sodium: 140 meq/L (ref 135–145)
Total Bilirubin: 0.4 mg/dL (ref 0.3–1.2)
Total Protein: 7.3 g/dL (ref 6.1–8.0)

## 2022-04-26 LAB — GLOMERULAR FILTRATION RATE, ESTIMATED: Est, Glom Filt Rate: >60 mL/min/{1.73_m2} (ref >60)

## 2022-04-26 LAB — OSMOLALITY: Osmolality Calc: 278.5 mOsmol/kg (ref 275.0–300.0)

## 2022-04-26 LAB — SCAN OF BLOOD SMEAR

## 2022-04-26 LAB — TROPONIN: Troponin, High Sensitivity: <6 ng/L (ref 0–12)

## 2022-04-26 LAB — ANION GAP: Anion Gap: 10 meq/L (ref 8.0–16.0)

## 2022-04-26 MED ORDER — SODIUM CHLORIDE 0.9 % IV BOLUS
0.9 % | Freq: Once | INTRAVENOUS | Status: AC
Start: 2022-04-26 — End: 2022-04-26
  Administered 2022-04-26: 21:00:00 1000 mL via INTRAVENOUS

## 2022-04-26 MED ORDER — DIPHENHYDRAMINE HCL 50 MG/ML IJ SOLN
50 MG/ML | Freq: Once | INTRAMUSCULAR | Status: AC
Start: 2022-04-26 — End: 2022-04-26
  Administered 2022-04-26: 21:00:00 25 mg via INTRAVENOUS

## 2022-04-26 MED ORDER — LISINOPRIL 5 MG PO TABS
5 | ORAL_TABLET | Freq: Every day | ORAL | 0 refills | 30.00000 days | Status: DC
Start: 2022-04-26 — End: 2024-03-04

## 2022-04-26 MED ORDER — ENALAPRILAT 1.25 MG/ML IV INJ
1.25 MG/ML | Freq: Once | INTRAVENOUS | Status: AC
Start: 2022-04-26 — End: 2022-04-26
  Administered 2022-04-26: 22:00:00 1.25 mg via INTRAVENOUS

## 2022-04-26 MED ORDER — METOCLOPRAMIDE HCL 5 MG/ML IJ SOLN
5 MG/ML | Freq: Once | INTRAMUSCULAR | Status: AC
Start: 2022-04-26 — End: 2022-04-26
  Administered 2022-04-26: 21:00:00 10 mg via INTRAVENOUS

## 2022-04-26 MED FILL — DIPHENHYDRAMINE HCL 50 MG/ML IJ SOLN: 50 MG/ML | INTRAMUSCULAR | Qty: 1

## 2022-04-26 MED FILL — ENALAPRILAT 1.25 MG/ML IV INJ: 1.25 MG/ML | INTRAVENOUS | Qty: 2

## 2022-04-26 MED FILL — METOCLOPRAMIDE HCL 5 MG/ML IJ SOLN: 5 MG/ML | INTRAMUSCULAR | Qty: 2

## 2022-04-26 NOTE — ED Provider Notes (Signed)
[facility]  eMERGENCY dEPARTMENT eNCOUnter          CHIEF COMPLAINT       Chief Complaint   Patient presents with    Headache       Nurses Notes reviewed and I agree except as noted in the HPI.      HISTORY OF PRESENT ILLNESS    Luis Santiago is a 51 y.o. male who presents headache dizziness.  Apparently he was at work when he had headache and dizziness, he will had his blood pressure checked by the nurse there, and it was high so they sent him to urgent care urgent care took an EKG became concerned despite the fact that the patient had no chest pain and sent him in here.  Here today the patient states that he took Tylenol for today.  It seems to be going away.  Patient had no alterations or vision taste or smell.  No loss of function on either side of his body.  No incoordination difficulty speaking or difficulty comprehending.  Here today the patient is rating his headache about a 6 out of 10.  It is global throughout his head.  Blood pressure remains slightly elevated 152/88.  Patient is neurologically intact otherwise resting comfortably on the cot no apparent distress.    REVIEW OF SYSTEMS     Review of Systems   Constitutional:  Negative for activity change, appetite change, diaphoresis, fatigue and unexpected weight change.   HENT:  Negative for congestion, ear discharge, ear pain, hearing loss, nosebleeds, rhinorrhea, sinus pressure, sore throat, trouble swallowing and voice change.    Eyes:  Negative for photophobia, pain, discharge, redness and itching.   Respiratory:  Negative for cough, choking, chest tightness, shortness of breath and wheezing.    Cardiovascular:  Negative for chest pain, palpitations and leg swelling.   Gastrointestinal:  Negative for abdominal distention, abdominal pain, blood in stool, constipation, diarrhea, nausea and vomiting.   Endocrine: Negative for polydipsia, polyphagia and polyuria.   Genitourinary:  Negative for decreased urine volume, difficulty  urinating, dysuria, enuresis, frequency, hematuria, penile pain, penile swelling, scrotal swelling and urgency.   Musculoskeletal:  Negative for arthralgias, back pain, gait problem, myalgias, neck pain and neck stiffness.   Skin:  Negative for pallor and rash.   Allergic/Immunologic: Negative for immunocompromised state.   Neurological:  Positive for light-headedness and headaches. Negative for dizziness, tremors, seizures, syncope, facial asymmetry, weakness and numbness.   Hematological:  Negative for adenopathy. Does not bruise/bleed easily.   Psychiatric/Behavioral:  Negative for agitation, hallucinations and suicidal ideas. The patient is not nervous/anxious.           PAST MEDICAL HISTORY    has a past medical history of Hypertension.    SURGICAL HISTORY      has no past surgical history on file.    CURRENT MEDICATIONS       Discharge Medication List as of 04/26/2022  7:15 PM          ALLERGIES     is allergic to dilantin [phenytoin].    FAMILY HISTORY     has no family status information on file.    family history is not on file.    SOCIAL HISTORY      reports that he has never smoked. He has never used smokeless tobacco. He reports that he does not use drugs.    PHYSICAL EXAM     INITIAL VITALS:  height is 5\' 10"  (1.778  m) and weight is 170 lb (77.1 kg). His oral temperature is 97.7 F (36.5 C). His blood pressure is 135/96 (abnormal) and his pulse is 73. His respiration is 16 and oxygen saturation is 100%.    Physical Exam  Vitals and nursing note reviewed.   Constitutional:       General: He is not in acute distress.     Appearance: He is well-developed. He is not diaphoretic.   HENT:      Head: Normocephalic and atraumatic.      Right Ear: Hearing, tympanic membrane, ear canal and external ear normal. No hemotympanum.      Left Ear: Hearing, tympanic membrane, ear canal and external ear normal. No hemotympanum.      Nose: Nose normal.      Right Nostril: No epistaxis or septal hematoma.      Left  Nostril: No epistaxis or septal hematoma.      Mouth/Throat:      Lips: Pink.      Tongue: No lesions. Tongue does not deviate from midline.      Palate: No mass.      Tonsils: No tonsillar exudate or tonsillar abscesses.   Eyes:      General: Lids are normal. No scleral icterus.        Right eye: No discharge.         Left eye: No discharge.      Conjunctiva/sclera: Conjunctivae normal.      Right eye: No exudate.     Left eye: No exudate.     Pupils: Pupils are equal, round, and reactive to light.   Neck:      Thyroid: No thyromegaly.      Vascular: No JVD.      Trachea: No tracheal deviation.   Cardiovascular:      Rate and Rhythm: Normal rate and regular rhythm.      Pulses: Normal pulses.           Carotid pulses are 2+ on the right side and 2+ on the left side.       Radial pulses are 2+ on the right side and 2+ on the left side.        Femoral pulses are 2+ on the right side and 2+ on the left side.       Popliteal pulses are 2+ on the right side and 2+ on the left side.        Dorsalis pedis pulses are 2+ on the right side and 2+ on the left side.        Posterior tibial pulses are 2+ on the right side and 2+ on the left side.      Heart sounds: Normal heart sounds, S1 normal and S2 normal. No murmur heard.     No friction rub. No gallop.   Pulmonary:      Effort: Pulmonary effort is normal. No respiratory distress.      Breath sounds: Normal breath sounds. No stridor. No decreased breath sounds, wheezing, rhonchi or rales.   Chest:      Chest wall: No tenderness.   Abdominal:      General: Bowel sounds are normal. There is no distension.      Palpations: Abdomen is soft. There is no mass.      Tenderness: There is no abdominal tenderness. There is no guarding or rebound.   Musculoskeletal:         General: No tenderness. Normal range of motion.  Cervical back: Normal range of motion and neck supple. Normal range of motion.      Right lower leg: No edema.      Left lower leg: No edema.   Lymphadenopathy:       Cervical: No cervical adenopathy.   Skin:     General: Skin is warm and dry.      Capillary Refill: Capillary refill takes less than 2 seconds.      Findings: No bruising, ecchymosis, lesion or rash.   Neurological:      General: No focal deficit present.      Mental Status: He is alert and oriented to person, place, and time. Mental status is at baseline.      GCS: GCS eye subscore is 4. GCS verbal subscore is 5. GCS motor subscore is 6.      Cranial Nerves: Cranial nerves 2-12 are intact. No cranial nerve deficit.      Sensory: Sensation is intact. No sensory deficit.      Motor: Motor function is intact. No weakness.      Coordination: Coordination is intact. Coordination normal.      Gait: Gait is intact. Gait normal.      Deep Tendon Reflexes: Reflexes are normal and symmetric. Reflexes normal.      Reflex Scores:       Tricep reflexes are 2+ on the right side and 2+ on the left side.       Bicep reflexes are 2+ on the right side and 2+ on the left side.       Brachioradialis reflexes are 2+ on the right side and 2+ on the left side.       Patellar reflexes are 2+ on the right side and 2+ on the left side.       Achilles reflexes are 2+ on the right side and 2+ on the left side.  Psychiatric:         Speech: Speech normal.         Behavior: Behavior normal.         Thought Content: Thought content normal.         Judgment: Judgment normal.           DIFFERENTIAL DIAGNOSIS:   Tension headache cluster headache migraine headache uncontrolled hypertension less likely intracranial injury    DIAGNOSTIC RESULTS     EKG: All EKG's are interpreted by the Emergency Department Physician who either signs or Co-signs this chart in the absence of a cardiologist.  EKG per urgent care normal sinus rhythm, normal axis, findings of early repolarization, ventricular rate of 65, PR interval 154 QRS duration 92 QT interval 428 QTc 445.    Repeat EKG here normal sinus rhythm mild sinus arrhythmia findings of early  repolarization, ventricular rate of 63 PR interval 156 QRS duration 94 QT interval 432 QTc of 442.    RADIOLOGY: non-plain film images(s) such as CT, Ultrasound and MRI are read by the radiologist.  CT HEAD WO CONTRAST   Final Result   1. No acute intracranial findings      This document has been electronically signed by: Adrian Prince, MD on    04/26/2022 05:57 PM      All CTs at this facility use dose modulation techniques and iterative    reconstructions, and/or weight-based dosing   when appropriate to reduce radiation to a low as reasonably achievable.            LABS:   Labs  Reviewed   CBC WITH AUTO DIFFERENTIAL - Abnormal; Notable for the following components:       Result Value    WBC 2.8 (*)     RBC 3.81 (*)     Hemoglobin 11.8 (*)     Hematocrit 36.7 (*)     MCV 96.3 (*)     RDW-SD 47.3 (*)     Segs Absolute 1.1 (*)     All other components within normal limits   COMPREHENSIVE METABOLIC PANEL W/ REFLEX TO MG FOR LOW K   TROPONIN   ANION GAP   GLOMERULAR FILTRATION RATE, ESTIMATED   OSMOLALITY   SCAN OF BLOOD SMEAR       EMERGENCY DEPARTMENT COURSE:   Vitals:    Vitals:    04/26/22 1646 04/26/22 1746 04/26/22 1825 04/26/22 1840   BP: (!) 173/97 (!) 161/97 134/88 (!) 135/96   Pulse: 61 59 64 73   Resp: Temp:       TempSrc:       SpO2: 99% 100% 100% 100%   Weight:       Height:         Patient was assessed at bedside Labs and imaging were ordered.  Patient was given Vasotec, he will also be given Reglan and Benadryl for his headache.  Neurologically the patient is intact.  Here today I reviewed all the labs and imaging.  I went back to reassess the patient.  Patient is feeling fine.  Labs appear to be within normal limits he does have an unexplained leukopenia.  I do not have any older labs I do not know where his white blood cell count usually runs.  Patient is not showing any signs of infection.  I went back to reevaluate the patient.  Patient is feeling much better.  At this point I feel the  patient be safely discharged home.  I did go over the fact that his white blood cell count was low, and that he needed to follow-up with his primary care physician both for titration of blood pressure medications as well as to run down this leukopenia.  I did explain to him that this may be chronic for him or it may be indicative of something else but we are not quite sure what that is at this time and he needs to follow-up with his primary care physician to continue to evaluate this.  Patient understood and agreed with the plan.  Patient is subsequently discharged home in improved condition.    Patient has what appears to be uncontrolled hypertension.  Patient is started on ACE inhibitor.  He is instructed to follow-up with primary care physician and do so within the next 1 to 2 days for titration of medication.  Patient also has what appears to be a mild leukopenia.  He needs to have this followed by his primary care physician to resolution or for further follow-up with hematology.  Patient is instructed to return to the nearest emergency room immediately for any new or worsening complaints.    CRITICAL CARE:   None    CONSULTS:  None    PROCEDURES:  None    FINAL IMPRESSION      1. Uncontrolled hypertension    2. Leukopenia, unspecified type    3. Tension headache          DISPOSITION/PLAN   Discharge    PATIENT REFERRED TO:  Lewie Loron, APRN - NP  Call in 1 day        DISCHARGE MEDICATIONS:  Discharge Medication List as of 04/26/2022  7:15 PM        START taking these medications    Details   lisinopril (PRINIVIL;ZESTRIL) 5 MG tablet Take 0.5 tablets by mouth daily, Disp-15 tablet, R-0Print             (Please note that portions of this note were completed with a voice recognition program.  Efforts were made to edit the dictations but occasionally words are mis-transcribed.)    Thea Silversmith, [place], DO  04/27/22 [phone]

## 2022-04-26 NOTE — Discharge Instructions (Signed)
Patient has what appears to be uncontrolled hypertension.  Patient is started on ACE inhibitor.  He is instructed to follow-up with primary care physician and do so within the next 1 to 2 days for titration of medication.  Patient also has what appears to be a mild leukopenia.  He needs to have this followed by his primary care physician to resolution or for further follow-up with hematology.  Patient is instructed to return to the nearest emergency room immediately for any new or worsening complaints.

## 2022-04-26 NOTE — ED Notes (Signed)
In for hourly rounding. Pt resting on cot in position of comfort. Pt remains A&Ox4, resps easy and unlabored. IV shows no s/s of infection or infiltration. Pt pain slightly improved at this time. Medicated pt per MAR. Monitor remains in place. Updated pt on POC. Will monitor.     Tawanna Cooler, RN  04/26/22 1800

## 2022-04-26 NOTE — ED Notes (Signed)
In for hourly rounding. Pt resting on cot in position of comfort. Pt remains A&Ox4, resps easy and unlabored. IV flushed and infusing, shows no s/s of infection or infiltration. Pt pain remains unchanged at this time. Medicated pt per MAR. Monitor remains in place. Updated pt on POC. Will monitor.     Vanessa Durham, RN  04/26/22 1746

## 2022-04-26 NOTE — ED Triage Notes (Signed)
Patient presents to the ED with complaints of headache. Patient states that this started at 9-10 this morning. Patient went to the nurse at work who noted he had some high blood pressure. Patient was sent to office in [place] where they did an EKG and sent him here. Patient is rating pain 8/10. Patient able to ambulate. GCS 15.

## 2022-05-02 LAB — EKG 12-LEAD
Atrial Rate: 63 {beats}/min
P Axis: 73 degrees
P-R Interval: 156 ms
Q-T Interval: 432 ms
QRS Duration: 94 ms
QTc Calculation (Bazett): 442 ms
R Axis: 70 degrees
T Axis: 68 degrees
Ventricular Rate: 63 {beats}/min

## 2023-05-19 ENCOUNTER — Emergency Department: Admit: 2023-05-20 | Payer: PRIVATE HEALTH INSURANCE

## 2023-05-19 DIAGNOSIS — M545 Low back pain, unspecified: Secondary | ICD-10-CM

## 2023-05-19 NOTE — ED Notes (Signed)
Pt had one tooth pulled on Saturday, after this pt experienced left side of neck and down back. Pt reports not going to work today. Is on amoxicillin and ibuprofen 800. Pain is 10/10 "feels stiff and unable to move" Denies chest pain denies nausea. Urination and Bms are normal for patient.

## 2023-05-19 NOTE — ED Provider Notes (Signed)
[facility] ED  Emergency Department Encounter  Emergency Medicine Attending     Pt Name:Luis Santiago  MRN: [id number]  Birthdate 02/05/1971  Date of evaluation: 05/19/23  PCP:  Lewie Loron, APRN - NP  Note Started: 11:32 PM EDT      CHIEF COMPLAINT       Chief Complaint   Patient presents with    Back Pain     Back pain x3 days       HISTORY OF PRESENT ILLNESS  (Location/Symptom, Timing/Onset, Context/Setting, Quality, Duration, Modifying Factors, Severity.)      52 year old male presents for evaluation of back pain.  Symptoms present for 2 days.  He states that it started after he finished from his dental office in his right lower back.  He states that he was sitting on the dental chair for approximately 3 to 4 hours which caused him significant pain in his back.  Denies any urinary or bowel issues.  No fevers or chills.  Denies any trauma or injuries.  He has been taking Motrin with no improvement of his symptoms.  Denies any numbness or weakness            PAST MEDICAL / SURGICAL / SOCIAL / FAMILY HISTORY      has a past medical history of Hypertension.     has no past surgical history on file.    Social History     Socioeconomic History    Marital status: Single     Spouse name: Not on file    Number of children: Not on file    Years of education: Not on file    Highest education level: Not on file   Occupational History    Not on file   Tobacco Use    Smoking status: Never    Smokeless tobacco: Never   Vaping Use    Vaping status: Never Used   Substance and Sexual Activity    Alcohol use: Not on file    Drug use: Never    Sexual activity: Not on file   Other Topics Concern    Not on file   Social History Narrative    Not on file     Social Determinants of Health     Financial Resource Strain: Not on file   Food Insecurity: Not on file   Transportation Needs: Not on file   Physical Activity: Not on file   Stress: Not on file   Social Connections: Unknown (12/25/2021)    Received from [facility]    Social Network     Social Network: Not on file   Intimate Partner Violence: Unknown (11/16/2021)    Received from [facility], [facility]    HITS     Physically Hurt: Not on file     Insult or Talk Down To: Not on file     Threaten Physical Harm: Not on file     Scream or Curse: Not on file   Housing Stability: Not At Risk (10/18/2020)    Received from [facility], [facility]    Housing Stability     Was there a time when you did not have a steady place to sleep: Not asked     Worried that the place you are staying is making you sick: Not asked       History reviewed. No pertinent family history.    Allergies:  Dilantin [phenytoin]    Home Medications:  Prior to  Admission medications    Medication Sig Start Date End Date Taking? Authorizing Provider   cyclobenzaprine (FLEXERIL) 5 MG tablet Take 1 tablet by mouth 2 times daily as needed for Muscle spasms 05/20/23 05/30/23 Yes Jailan Trimm, Gar Gibbon, DO   lidocaine (LIDODERM) 5 % Place 1 patch onto the skin daily for 10 days 12 hours on, 12 hours off. 05/20/23 05/30/23 Yes Ok Edwards, DO   lisinopril (PRINIVIL;ZESTRIL) 5 MG tablet Take 0.5 tablets by mouth daily 04/26/22 05/26/22  Aliene Beams T, DO         REVIEW OF SYSTEMS       Review of Systems   Constitutional:  Negative for chills and fever.   Eyes:  Negative for visual disturbance.   Respiratory:  Negative for chest tightness, shortness of breath and wheezing.    Cardiovascular:  Negative for chest pain and leg swelling.   Gastrointestinal:  Negative for abdominal pain, nausea and vomiting.   Genitourinary:  Negative for difficulty urinating.   Musculoskeletal:  Positive for back pain.   Skin:  Negative for color change and wound.   Neurological:  Negative for dizziness, weakness, light-headedness and numbness.       PHYSICAL EXAM      INITIAL VITALS:   BP 134/81   Pulse 86   Temp 98.4 F (36.9 C) (Temporal)   Resp 17   Ht 1.778 m (5\' 10" )   Wt 74.8 kg (165 lb)   SpO2 100%   BMI 23.68 kg/m      Physical Exam  Vitals and nursing note reviewed.   Constitutional:       General: He is not in acute distress.     Appearance: Normal appearance. He is not ill-appearing.   HENT:      Head: Normocephalic and atraumatic.      Nose: Nose normal.      Mouth/Throat:      Mouth: Mucous membranes are moist.   Eyes:      Pupils: Pupils are equal, round, and reactive to light.   Cardiovascular:      Rate and Rhythm: Normal rate.      Pulses: Normal pulses.      Heart sounds: No murmur heard.  Pulmonary:      Effort: Pulmonary effort is normal.      Breath sounds: No wheezing.   Musculoskeletal:         General: Normal range of motion.        Arms:       Cervical back: Normal range of motion. No deformity or tenderness.      Thoracic back: No deformity or tenderness.      Lumbar back: No swelling, edema, deformity or bony tenderness. Negative right straight leg raise test and negative left straight leg raise test.   Skin:     General: Skin is warm and dry.      Capillary Refill: Capillary refill takes less than 2 seconds.   Neurological:      General: No focal deficit present.      Mental Status: He is alert and oriented to person, place, and time. Mental status is at baseline.      GCS: GCS eye subscore is 4. GCS verbal subscore is 5. GCS motor subscore is 6.      Cranial Nerves: No cranial nerve deficit.      Sensory: No sensory deficit.      Motor: No weakness.      Comments: Strength intact  in bilateral lower extremities.           DDX/DIAGNOSTIC RESULTS / EMERGENCY DEPARTMENT COURSE / MDM     Medical Decision Making  52 year old male presenting for back pain.  Vitals stable.  Patient well-appearing.  No deformities noted.  Neurologically intact.  Good strength in bilateral lower extremities.  CT exam showing no acute abnormalities.  There are degenerative changes.  Patient did have improvement of his symptoms after receiving medications.  Will plan for NSAIDs and muscle relaxers follow-up with PCP.    Shared  decision performed with patient/family/friends regarding discharge.  They are in agreement at this time for plan for discharge.  They were advised to follow-up with appropriate specialists and PCP in 1 day regarding this visit and diagnosis.  They were given opportunity for questioning and questions were answered to their satisfaction.  Appropriate prescriptions and referrals were sent digitally and/or via discharge paperwork.  Discharged in no distress and alert and oriented per their baseline.  They were advised to return to the emergency department for any new or worsening symptoms or if needing further evaluation.      Amount and/or Complexity of Data Reviewed  Radiology: ordered.    Risk  Prescription drug management.            RADIOLOGY:         Interpretation per the Radiologist below, if available at the time of this note:    CT LUMBAR SPINE WO CONTRAST   Final Result   1. No convincing acute abnormality is seen of the lumbar spine.   2. Scattered degenerative changes with question of mild spinal canal stenosis   at L2-L3.   3. Mild bilateral neural foraminal narrowing at L3-L4.   4. Schmorl's nodes involving the endplates at L4-L5.               EMERGENCY DEPARTMENT COURSE:           Labs Reviewed - No data to display    PROCEDURES:    Procedures    CONSULTS:  None      FINAL IMPRESSION      1. Acute right-sided low back pain without sciatica          DISPOSITION / PLAN     DISPOSITION Decision To Discharge 05/20/2023 12:20:15 AM  Condition at Disposition: Data Unavailable      PATIENT REFERRED TO:  Lewie Loron, APRN - NP  [id number] [facility]  [place] [address]  [phone]    Schedule an appointment as soon as possible for a visit   To follow-up on this visit      DISCHARGE MEDICATIONS:  Discharge Medication List as of 05/20/2023 12:21 AM        START taking these medications    Details   cyclobenzaprine (FLEXERIL) 5 MG tablet Take 1 tablet by mouth 2 times daily as needed for Muscle spasms,  Disp-15 tablet, R-0Normal      lidocaine (LIDODERM) 5 % Place 1 patch onto the skin daily for 10 days 12 hours on, 12 hours off., Disp-10 patch, R-0Normal             Ok Edwards, DO  Emergency Medicine Physician     (Please note that portions of thisnote were completed with a voice recognition program.  Efforts were made to edit the dictations but occasionally words are mis-transcribed.)        Chau, Sawin, DO  05/20/23 [id number]

## 2023-05-20 ENCOUNTER — Inpatient Hospital Stay
Admit: 2023-05-20 | Discharge: 2023-05-20 | Disposition: A | Discharge status: Home / Self Care | Payer: PRIVATE HEALTH INSURANCE | Arrived: VH

## 2023-05-20 MED ORDER — LIDOCAINE 5 % EX PTCH
5 | MEDICATED_PATCH | Freq: Every day | CUTANEOUS | 0 refills | Status: DC
Start: 2023-05-20 — End: 2023-05-20

## 2023-05-20 MED ORDER — LIDOCAINE 5 % EX PTCH
5 | MEDICATED_PATCH | Freq: Every day | CUTANEOUS | 0 refills | Status: AC
Start: 2023-05-20 — End: 2023-05-30

## 2023-05-20 MED ORDER — KETOROLAC TROMETHAMINE 30 MG/ML IJ SOLN
30 | Freq: Once | INTRAMUSCULAR | Status: AC
Start: 2023-05-20 — End: 2023-05-19
  Administered 2023-05-20: 04:00:00 30 mg via INTRAMUSCULAR

## 2023-05-20 MED ORDER — DIAZEPAM 5 MG PO TABS
5 | Freq: Once | ORAL | Status: AC
Start: 2023-05-20 — End: 2023-05-19
  Administered 2023-05-20: 04:00:00 5 mg via ORAL

## 2023-05-20 MED ORDER — CYCLOBENZAPRINE HCL 5 MG PO TABS
5 | ORAL_TABLET | Freq: Two times a day (BID) | ORAL | 0 refills | Status: AC | PRN
Start: 2023-05-20 — End: 2023-05-30

## 2023-05-20 MED ORDER — CYCLOBENZAPRINE HCL 5 MG PO TABS
5 | ORAL_TABLET | Freq: Two times a day (BID) | ORAL | 0 refills | Status: DC | PRN
Start: 2023-05-20 — End: 2023-05-20

## 2023-05-20 MED FILL — KETOROLAC TROMETHAMINE 30 MG/ML IJ SOLN: 30 MG/ML | INTRAMUSCULAR | Qty: 1

## 2023-05-20 MED FILL — DIAZEPAM 5 MG PO TABS: 5 MG | ORAL | Qty: 1

## 2024-03-03 DIAGNOSIS — R55 Syncope and collapse: Principal | ICD-10-CM

## 2024-03-03 NOTE — ED Notes (Signed)
 ED to inpatient nurses report      Chief Complaint:  Chief Complaint   Patient presents with    Hypotension     Present to ED from: home    MOA:     LOC: alert and orientated to name, place, date  Mobility: Independent  Oxygen Baseline: 95-100%    Current needs required: na   Pending ED orders: na  Present condition: stable    Why did the patient come to the ED?   Patient presents to ED via EMS on stretcher with complaints of hypotension.   Patient is awake and alert at time of arrival.   Patient stated he was sitting in a chair, outside with his friend, when he had a period of confusion and unresponsiveness.   Patient states he did not fall although he does state he does not remember the incident, only just before and after.   EMS stated patient systolic BP on arrival was in the 80s.   20G PIV in place on arrival with fluids running.   Patient in NAD, A/Ox4, call light within reach, bed in lowest position.   Patient placed on continuous cardiac monitor, bp and pulse ox. EKG completed, IV patency confirmed, blood work drawn.         What is the plan? Admission to OBS for card in AM   Any procedures or intervention occur? Labs, fluids, xray   Any safety concerns?? None     Mental Status:       Psych Assessment:   Psychosocial  Psychosocial (WDL): Within Defined Limits  Vital signs   Vitals:    03/03/24 2208 03/03/24 2218 03/03/24 2228 03/03/24 2238   BP:  119/81 118/80    Pulse:  72 76 81   Resp: 16      Temp:       TempSrc:       SpO2:  100% 100% 100%        Vitals:  Patient Vitals for the past 24 hrs:   BP Temp Temp src Pulse Resp SpO2   03/03/24 2238 -- -- -- 81 -- 100 %   03/03/24 2228 118/80 -- -- 76 -- 100 %   03/03/24 2218 119/81 -- -- 72 -- 100 %   03/03/24 2208 -- -- -- -- 16 --   03/03/24 2208 -- -- -- 75 -- 100 %   03/03/24 2158 116/77 -- -- 82 -- 100 %   03/03/24 2148 113/75 -- -- 82 -- 100 %   03/03/24 2128 118/71 -- -- 83 -- 96 %   03/03/24 2118 121/67 -- -- 85 -- 100 %   03/03/24 2100 116/77 -- -- 93  -- 100 %   03/03/24 2051 -- 98.5 F (36.9 C) Oral -- -- --   03/03/24 2050 -- -- -- 74 -- 100 %      Visit Vitals  BP 118/80   Pulse 81   Temp 98.5 F (36.9 C) (Oral)   Resp 16   SpO2 100%        LDAs:   Peripheral IV 03/03/24 Distal;Right Cephalic (Active)       Ambulatory Status:  Presents to emergency department  because of falls (Syncope, seizure, or loss of consciousness): No, Age > 70: No, Altered Mental Status, Intoxication with alcohol or substance confusion (Disorientation, impaired judgment, poor safety awaremess, or inability to follow instructions): No, Impaired Mobility: Ambulates or transfers with assistive devices or assistance; Unable to ambulate or transer.: No,  Nursing Judgement: No    Diagnosis:  DISPOSITION Decision To Admit 03/03/2024 11:19:55 PM   Final diagnoses:   Loss of consciousness (HCC)        Consults:  None     Treatment Team:   Treatment Team:   Fredericka Pac, DO  Connye Suzen Munroe, MD  Ophelia Purchase, RN    Treatment:  ED Course as of 03/03/24 2321   Wed Mar 03, 2024   2316 On reevaluation patient notes that he went to the bathroom and legs felt weaker which was not present prior to loss of consciousness episode. [KM]      ED Course User Index  [KM] Connye Suzen Munroe, MD          Skin Assessment:        Pain Score:  Pain Assessment  Pain Assessment: None - Denies Pain      SOCIAL HISTORY       Social History     Socioeconomic History    Marital status: Single   Tobacco Use    Smoking status: Never    Smokeless tobacco: Never   Vaping Use    Vaping status: Never Used   Substance and Sexual Activity    Drug use: Never     Social Drivers of Psychologist, prison and probation services Strain: Low Risk  (12/11/2023)    Received from [facility]    Overall [employer] (CARDIA)     Difficulty of Paying Living Expenses: Not hard at all   Food Insecurity: No Food Insecurity (12/11/2023)    Received from [facility] [employer] Vital Sign     Within the past  12 months, you worried that your food would run out before you got the money to buy more.: Never true     Within the past 12 months, the food you bought just didn't last and you didn't have money to get more.: Never true   Transportation Needs: No Transportation Needs (12/11/2023)    Received from [facility] [employer]     In the past 12 months, has lack of transportation kept you from medical appointments or from getting medications?: No     In the past 12 months, has lack of transportation kept you from meetings, work, or from getting things needed for daily living?: No   Physical Activity: Sufficiently Active (12/11/2023)    Received from [facility] [place]    Exercise Vital Sign     Days of Exercise per Week: 6 days     Minutes of Exercise per Session: 40 min   Stress: No Stress Concern Present (12/11/2023)    [facility] from [facility] of Occupational Health - Occupational Stress Questionnaire     Feeling of Stress : Only a little   Social Connections: Unknown (12/11/2023)    Received from [facility] [employer] and Isolation Panel [NHANES]     Frequency of Communication with Friends and Family: Once a week     Frequency of Social Gatherings with Friends and Family: Once a week     Attends Religious Services: 1 to 4 times per year     Active Member of [employer] or Organizations: Yes     Attends Engineer, structural: More than 4 times per year     Marital Status: Patient declined   Catering manager  Violence: Not At Risk (12/11/2023)    Received from [facility] [employer], Afraid, Rape, and Kick questionnaire     Fear of Current or Ex-Partner: No     Emotionally Abused: No     Physically Abused: No     Sexually Abused: No   Housing Stability: Low Risk  (12/11/2023)    [facility] from [facility] [employer] [facility] Vital Sign     In the last 12 months, was there a time when you were not able to pay the  mortgage or rent on time?: No     At any time in the past 12 months, were you homeless or living in a shelter (including now)?: No       FAMILY HISTORY     No family history on file.    ALLERGIES     Dilantin [phenytoin]    CURRENT MEDICATIONS       Previous Medications    LISINOPRIL  (PRINIVIL ;ZESTRIL ) 5 MG TABLET    Take 0.5 tablets by mouth daily     Orders Placed This Encounter   Medications    sodium chloride  0.9 % bolus 1,000 mL       SURGICAL HISTORY     No past surgical history on file.    PAST MEDICAL HISTORY       Past Medical History:   Diagnosis Date    Hypertension        Labs:  Labs Reviewed   CBC WITH AUTO DIFFERENTIAL - Abnormal; Notable for the following components:       Result Value    RBC 3.52 (*)     Hemoglobin 10.9 (*)     Hematocrit 33.1 (*)     Eosinophils % 6 (*)     All other components within normal limits   COMPREHENSIVE METABOLIC PANEL - Abnormal; Notable for the following components:    Sodium 135 (*)     Glucose 203 (*)     Calcium 8.2 (*)     All other components within normal limits   MAGNESIUM        Electronically signed by Reyes Becton, RN on 03/03/2024 at 11:21 PM

## 2024-03-03 NOTE — ED Notes (Signed)
 Report given to [facility]. All questions answered.

## 2024-03-03 NOTE — ED Provider Notes (Addendum)
 [facility] EMERGENCY DEPARTMENT     Emergency Department     Faculty Attestation        I performed a history and physical examination of the patient and discussed management with the resident. I reviewed the resident's note and agree with the documented findings and plan of care. Any areas of disagreement are noted on the chart. I was personally present for the key portions of any procedures. I have documented in the chart those procedures where I was not present during the key portions. I have reviewed the emergency nurses triage note. I agree with the chief complaint, past medical history, past surgical history, allergies, medications, social and family history as documented unless otherwise noted below.    For mid-level providers such as nurse practitioners as well as physicians assistants:    I have personally seen and evaluated the patient.    I find the patient's history and physical exam are consistent with NP/PA documentation.  I agree with the care provided, treatment rendered, disposition, & follow-up plan.     Additional findings are as noted.    Vital Signs: There were no vitals taken for this visit.  PCP:  Quin Dagoberto BIRCH, APRN - NP  Note Started: 8:55 PM EDT    Pertinent Comments:     Patient is a 53 y/o M who presented for concerns of low blood pressure.    Patient had a syncopal episode while sitting outside today. Witnessed. Was reportedly hypotensive for EMS on arrival with systolic of 80.    Per patient's story he was sitting down when he started feeling gradually more tired and then passed out.  Was reportedly out for couple minutes.  Patient does not believe he had any prodromes, denies any chest pain or shortness of breath at time of an episode, denied any palpitations.  Denies any alcohol, marijuana or other drug use.    Prev episode 18 MAY that was 2/2 hypoglycemia.    Patient had a treadmill stress test on [id number] which was negative. ECHO also  obtained at that time showed EF 50-55%.    At time of initial evaluation patient was in no acute distress, vital signs were noted to be stable.  Patient was normotensive with heart rate 81 and regular.  Lungs: Clear bilaterally.  Abdomen soft nontender nondistended.  Patient with +2 pulses in the bilateral upper and lower extremities.  Strength and sensation intact.  No dysmetria.  Patient alert and oriented x 4, just notes that he feel little bit off .    Will plan for laboratory workup, EKG.    EKG Interpretation    Interpreted by me    Rhythm: normal sinus   Rate: normal  Axis: normal  Ectopy: none  Conduction: Prolonged QTc  ST Segments: no acute change  T Waves: no acute change  Q Waves: none    Clinical Impression: Prolonged QTc, no acute changes    11:26 PM  Patient's workup overall unremarkable.  Did have normal echo and a treadmill stress test back in January.  Previous episode of syncope was noted to be hypoglycemia however does appear hyperglycemic today.  Anion gap 10, CO2 was 24, no concern for DKA. Patient states that he feels off still.  No improvement with IVF.  Hemoglobin 10.9, down from 11.8 approximately a year ago.  Unclear etiology of syncopal episode at this time but patient is concerned about going home as she lives alone.  Will plan for placement in observation  unit, consider cardiology evaluation for Holter monitor or loop recorder.  Therapist is follow-up with primary care provider for further evaluation of his anemia.      Critical Care  None          Fairy Ronde, DO    Attending Emergency Medicine Physician            Ronde Fairy, DO  03/03/24 [id number]       Ronde Fairy, DO  03/03/24 2336

## 2024-03-03 NOTE — ED Provider Notes (Signed)
 [place] [facility] EMERGENCY DEPARTMENT  Emergency Department Encounter  Emergency Medicine Resident     Pt Name:Luis Santiago  MRN: [id number]  Birthdate January 15, 1971  Date of evaluation: 03/03/24  PCP:  Quin Dagoberto BIRCH, APRN - NP  Note Started: 9:26 PM EDT      CHIEF COMPLAINT       Chief Complaint   Patient presents with    Hypotension       HISTORY OF PRESENT ILLNESS  (Location/Symptom, Timing/Onset, Context/Setting, Quality, Duration, Modifying Factors, Severity.)    Luis Santiago 53 year old male, history of HTN on Lisinopril  5 mg and prediabetes, who presents to the emergency department via EMS for episode suspected syncope. According to patient, prior to arrival he was sitting outside with his friends when he suddenly felt faint and sweaty and then had a loss of consciousness.  Patient states that his friends had witnessed him have an unconscious episode and was shaking for unspecified amount of time approximately a few minutes. EMS reported the patient had reported blood pressure of 87/54, followed by 106/54, point-of-care glucose was 194, and patient received fluids bolus and route.    Patient denies any current chest pain, nausea, vomiting, shortness of breath, abdominal pain, diarrhea, melena, hematochezia, dysuria, polyuria, or hematuria. He denies any recent cough, cold, or fevers. Patient denies any alcohol use for the past few days, recreational drug use, but notes occasional cigar use.     PAST MEDICAL / SURGICAL / SOCIAL / FAMILY HISTORY   Past medical history  Hypertension, essential  Dyslipidemia  Prediabetes  Renal cyst  Costochondritis  Benign prostatic hyperplasia without lower urinary tract symptoms    Social History     Socioeconomic History    Marital status: Single     Spouse name: Not on file    Number of children: Not on file    Years of education: Not on file    Highest education level: Not on file   Occupational History    Not on file   Tobacco Use    Smoking status: Never     Smokeless tobacco: Never   Vaping Use    Vaping status: Never Used   Substance and Sexual Activity    Alcohol use: Not on file    Drug use: Never    Sexual activity: Not on file   Other Topics Concern    Not on file   Social History Narrative    Not on file     Social Drivers of Health     Financial Resource Strain: Low Risk  (12/11/2023)    Received from [facility] [place]    Overall [employer] (CARDIA)     Difficulty of Paying Living Expenses: Not hard at all   Food Insecurity: No Food Insecurity (12/11/2023)    Received from [facility] [employer] Vital Sign     Within the past 12 months, you worried that your food would run out before you got the money to buy more.: Never true     Within the past 12 months, the food you bought just didn't last and you didn't have money to get more.: Never true   Transportation Needs: No Transportation Needs (12/11/2023)    Received from [employer] [facility] [employer]     In the past 12 months, has lack of transportation kept you from medical appointments or from getting medications?: No     In the past 12  months, has lack of transportation kept you from meetings, work, or from getting things needed for daily living?: No   Physical Activity: Sufficiently Active (12/11/2023)    [facility] from [facility]    Exercise Vital Sign     Days of Exercise per Week: 6 days     Minutes of Exercise per Session: 40 min   Stress: No Stress Concern Present (12/11/2023)    Received from [facility] of Occupational Health - Occupational Stress Questionnaire     Feeling of Stress : Only a little   Social Connections: Unknown (12/11/2023)    Received from [facility] [employer] and Isolation Panel [NHANES]     Frequency of Communication with Friends and Family: Once a week     Frequency of Social Gatherings with Friends and Family: Once a week     Attends Religious Services: 1 to 4 times per year      Active Member of [employer] or Organizations: Yes     Attends Engineer, structural: More than 4 times per year     Marital Status: Patient declined   Intimate Partner Violence: Not At Risk (12/11/2023)    Received from [employer] [facility] [employer], Afraid, Rape, and Kick questionnaire     Fear of Current or Ex-Partner: No     Emotionally Abused: No     Physically Abused: No     Sexually Abused: No   Housing Stability: Low Risk  (12/11/2023)    [facility] [employer] [facility] Vital Sign     In the last 12 months, was there a time when you were not able to pay the mortgage or rent on time?: No     Number of Times Moved in the Last Year: Not on file     At any time in the past 12 months, were you homeless or living in a shelter (including now)?: No       No family history on file.    Allergies:  Dilantin [phenytoin]    Home Medications:  Prior to Admission medications    Medication Sig Start Date End Date Taking? Authorizing Provider   lisinopril  (PRINIVIL ;ZESTRIL ) 5 MG tablet Take 0.5 tablets by mouth daily 04/26/22 05/26/22  Gretel Rush T, DO       REVIEW OF SYSTEMS       Review of Systems  See HPI.     PHYSICAL EXAM      INITIAL VITALS:   BP 118/80   Pulse 85   Temp 98.5 F (36.9 C) (Oral)   Resp 16   SpO2 100%     Physical Exam  Constitutional:       Appearance: Normal appearance.   Eyes:      Pupils: Pupils are equal, round, and reactive to light.   Cardiovascular:      Rate and Rhythm: Normal rate and regular rhythm.      Heart sounds: No murmur heard.  Pulmonary:      Effort: No respiratory distress.      Breath sounds: No wheezing, rhonchi or rales.   Abdominal:      General: There is no distension.      Tenderness: There is no abdominal tenderness. There is no guarding or rebound.   Musculoskeletal:         General: No swelling or tenderness.  Neurological:      Mental Status: He is alert.      Comments: Strength in all 4 extremities.           DDX/DIAGNOSTIC  RESULTS / EMERGENCY DEPARTMENT COURSE / MDM     Medical Decision Making  Amount and/or Complexity of Data Reviewed  Labs: ordered.    Risk  Prescription drug management.    Luis Santiago 53 year old male, history of HTN on Lisinopril  5 mg, who presents to the emergency department via EMS for episode of unconsciousness. According to patient, prior to arrival he was sitting outside with his friends when he suddenly felt faint and sweaty and then had a loss of consciousness.  EMS was called to the scene no hypoglycemic episode recorded, was hypotensive and received fluids and route.    Chart review reveals documented history of prediabetes, hypoglycemic episode 12/28/2023 seen at [facility] emergency department, and previous treadmill stress test and echocardiogram on 08/30/2023.    Given history and clinical state CMP, CBC, and magnesium  were obtained.    EKG    EKG Interpretation    Interpreted by emergency department physician    Rhythm: normal sinus   Rate: 82bpm  Axis: normal  Ectopy: none  Conduction: QT prolongation  ST Segments: no acute change  T Waves: inversion in  v2  Q Waves: none    Clinical Impression: When compared to EKG from 05/01/2022 no significant changes.    Suzen Alfonso Hines, MD   All EKG's are interpreted by the Emergency Department Physician who either signs or Co-signs this chart in the absence of a cardiologist.    EMERGENCY DEPARTMENT COURSE:    ED Course as of 03/03/24 2348   Wed Mar 03, 2024   2316 On reevaluation patient notes that he went to the bathroom and legs felt weaker which was not present prior to loss of consciousness episode. [KM]   2339 Discussed with patient lab results and unclear cause of syncope given no hypoglycemic episodes or metabolic abnormalities.  Given prodrome of sweating and feeling faint prior to episode concern for underlying cardiac etiology in addition feeling weak since episode, discussed option with patient to stay for observation with cardiology consult in  the morning or follow-up with PCP outpatient.  Patient expressed concern that he would not follow-up with PCP and/or additional specialists outpatient, shared decision that observation with cardiology consult would be best for patient circumstances.   [KM]      ED Course User Index  [KM] Hines Suzen Alfonso, MD       CONSULTS:  IP CONSULT TO CARDIOLOGY    CRITICAL CARE:  There was significant risk of life threatening deterioration of patient's condition requiring my direct management. Critical care time 0 minutes, excluding any documented procedures.    FINAL IMPRESSION      1. Syncope and collapse          DISPOSITION / PLAN     DISPOSITION Admitted 03/03/2024 11:37:55 PM   DISPOSITION CONDITION Stable           PATIENT REFERRED TO:  No follow-up provider specified.    DISCHARGE MEDICATIONS:  New Prescriptions    No medications on file       Suzen Alfonso Hines, MD  Emergency Medicine Resident    (Please note that portions of this note were completed with a voice recognition program.  Efforts were made to edit the dictations but occasionally words are mis-transcribed.)

## 2024-03-03 NOTE — ED Notes (Signed)
 Patient presents to ED via EMS on stretcher with complaints of hypotension.   Patient is awake and alert at time of arrival.   Patient stated he was sitting in a chair, outside with his friend, when he had a period of confusion and unresponsiveness.   Patient states he did not fall although he does state he does not remember the incident, only just before and after.   EMS stated patient systolic BP on arrival was in the 80s.   20G PIV in place on arrival with fluids running.   Patient in NAD, A/Ox4, call light within reach, bed in lowest position.   Patient placed on continuous cardiac monitor, bp and pulse ox. EKG completed, IV patency confirmed, blood work drawn.

## 2024-03-04 ENCOUNTER — Observation Stay
Admit: 2024-03-04 | Discharge: 2024-03-04 | Disposition: A | Discharge status: Home / Self Care | Attending: Emergency Medicine | Admitting: Emergency Medicine

## 2024-03-04 DIAGNOSIS — R55 Syncope and collapse: Principal | ICD-10-CM

## 2024-03-04 LAB — CBC WITH AUTO DIFFERENTIAL
Basophils %: 0 % (ref 0–2)
Basophils %: 1 % (ref 0–2)
Basophils Absolute: <0.03 k/uL (ref 0.00–0.20)
Basophils Absolute: <0.03 k/uL (ref 0.00–0.20)
Eosinophils %: 6 % — ABNORMAL HIGH (ref 1–4)
Eosinophils %: 5 % — ABNORMAL HIGH (ref 1–4)
Eosinophils Absolute: 0.22 k/uL (ref 0.00–0.44)
Eosinophils Absolute: 0.21 k/uL (ref 0.00–0.44)
Hematocrit: 33.1 % — ABNORMAL LOW (ref 40.7–50.3)
Hematocrit: 34.1 % — ABNORMAL LOW (ref 40.7–50.3)
Hemoglobin: 10.9 g/dL — ABNORMAL LOW (ref 13.0–17.0)
Hemoglobin: 11.1 g/dL — ABNORMAL LOW (ref 13.0–17.0)
Immature Granulocytes %: 0 %
Immature Granulocytes %: 0 %
Immature Granulocytes Absolute: <0.03 k/uL (ref 0.00–0.30)
Immature Granulocytes Absolute: <0.03 k/uL (ref 0.00–0.30)
Lymphocytes %: 35 % (ref 24–43)
Lymphocytes %: 29 % (ref 24–43)
Lymphocytes Absolute: 1.33 k/uL (ref 1.10–3.70)
Lymphocytes Absolute: 1.22 k/uL (ref 1.10–3.70)
MCH: 31 pg (ref 25.2–33.5)
MCH: 31.1 pg (ref 25.2–33.5)
MCHC: 32.9 g/dL (ref 28.4–34.8)
MCHC: 32.6 g/dL (ref 28.4–34.8)
MCV: 94 fL (ref 82.6–102.9)
MCV: 95.5 fL (ref 82.6–102.9)
MPV: 10.3 fL (ref 8.1–13.5)
MPV: 10.3 fL (ref 8.1–13.5)
Monocytes %: 12 % (ref 3–12)
Monocytes %: 17 % — ABNORMAL HIGH (ref 3–12)
Monocytes Absolute: 0.47 k/uL (ref 0.10–1.20)
Monocytes Absolute: 0.69 k/uL (ref 0.10–1.20)
NRBC Automated: 0 /100{WBCs}
NRBC Automated: 0 /100{WBCs}
Neutrophils %: 47 % (ref 36–65)
Neutrophils %: 48 % (ref 36–65)
Neutrophils Absolute: 1.75 k/uL (ref 1.50–8.10)
Neutrophils Absolute: 2.02 k/uL (ref 1.50–8.10)
Platelets: 188 k/uL (ref 138–453)
Platelets: 200 k/uL (ref 138–453)
RBC: 3.52 m/uL — ABNORMAL LOW (ref 4.21–5.77)
RBC: 3.57 m/uL — ABNORMAL LOW (ref 4.21–5.77)
RDW: 13.3 % (ref 11.8–14.4)
RDW: 13.4 % (ref 11.8–14.4)
WBC: 3.8 k/uL (ref 3.5–11.3)
WBC: 4.2 k/uL (ref 3.5–11.3)

## 2024-03-04 LAB — EKG 12-LEAD
Atrial Rate: 85 {beats}/min
P Axis: 63 degrees
P-R Interval: 168 ms
Q-T Interval: 426 ms
QRS Duration: 92 ms
QTc Calculation (Bazett): 497 ms
R Axis: 56 degrees
T Axis: 70 degrees
Ventricular Rate: 82 {beats}/min

## 2024-03-04 LAB — COMPREHENSIVE METABOLIC PANEL W/ REFLEX TO MG FOR LOW K
ALT: 43 U/L (ref 10–50)
AST: 26 U/L (ref 10–50)
Albumin/Globulin Ratio: 1.2 (ref 1.0–2.5)
Albumin: 3.7 g/dL (ref 3.5–5.2)
Alkaline Phosphatase: 91 U/L (ref 40–129)
Anion Gap: 9 mmol/L (ref 9–16)
BUN: 12 mg/dL (ref 6–20)
CO2: 24 mmol/L (ref 20–31)
Calcium: 8.4 mg/dL — ABNORMAL LOW (ref 8.6–10.4)
Chloride: 106 mmol/L (ref 98–107)
Creatinine: 0.9 mg/dL (ref 0.7–1.2)
Est, Glom Filt Rate: >90 mL/min/1.73m2 (ref >60)
Glucose: 91 mg/dL (ref 74–99)
Potassium: 3.9 mmol/L (ref 3.7–5.3)
Sodium: 139 mmol/L (ref 136–145)
Total Bilirubin: 0.3 mg/dL (ref 0.0–1.2)
Total Protein: 6.7 g/dL (ref 6.6–8.7)

## 2024-03-04 LAB — COMPREHENSIVE METABOLIC PANEL
ALT: 49 U/L (ref 10–50)
AST: 32 U/L (ref 10–50)
Albumin/Globulin Ratio: 1.4 (ref 1.0–2.5)
Albumin: 3.9 g/dL (ref 3.5–5.2)
Alkaline Phosphatase: 94 U/L (ref 40–129)
Anion Gap: 10 mmol/L (ref 9–16)
BUN: 16 mg/dL (ref 6–20)
CO2: 24 mmol/L (ref 20–31)
Calcium: 8.2 mg/dL — ABNORMAL LOW (ref 8.6–10.4)
Chloride: 101 mmol/L (ref 98–107)
Creatinine: 1.1 mg/dL (ref 0.7–1.2)
Est, Glom Filt Rate: 79 mL/min/1.73m2 (ref >60)
Glucose: 203 mg/dL — ABNORMAL HIGH (ref 74–99)
Potassium: 3.8 mmol/L (ref 3.7–5.3)
Sodium: 135 mmol/L — ABNORMAL LOW (ref 136–145)
Total Bilirubin: 0.3 mg/dL (ref 0.0–1.2)
Total Protein: 6.7 g/dL (ref 6.6–8.7)

## 2024-03-04 LAB — TROPONIN: Troponin, High Sensitivity: <6 ng/L (ref 0–22)

## 2024-03-04 LAB — MAGNESIUM: Magnesium: 2 mg/dL (ref 1.6–2.6)

## 2024-03-04 MED ORDER — SODIUM CHLORIDE 0.9 % IV BOLUS
0.9 | Freq: Once | INTRAVENOUS | Status: AC
Start: 2024-03-04 — End: 2024-03-04
  Administered 2024-03-04: 02:00:00 1000 mL via INTRAVENOUS

## 2024-03-04 MED ORDER — SODIUM CHLORIDE 0.9 % IV SOLN
0.9 | INTRAVENOUS | Status: DC | PRN
Start: 2024-03-04 — End: 2024-03-04

## 2024-03-04 MED ORDER — ONDANSETRON HCL 4 MG/2ML IJ SOLN
4 | Freq: Four times a day (QID) | INTRAMUSCULAR | Status: DC | PRN
Start: 2024-03-04 — End: 2024-03-04

## 2024-03-04 MED ORDER — POLYETHYLENE GLYCOL 3350 17 G PO PACK
17 | Freq: Every day | ORAL | Status: DC | PRN
Start: 2024-03-04 — End: 2024-03-04

## 2024-03-04 MED ORDER — LISINOPRIL 2.5 MG PO TABS
2.5 | Freq: Every day | ORAL | Status: DC
Start: 2024-03-04 — End: 2024-03-04

## 2024-03-04 MED ORDER — NORMAL SALINE FLUSH 0.9 % IV SOLN
0.9 | INTRAVENOUS | Status: DC | PRN
Start: 2024-03-04 — End: 2024-03-04

## 2024-03-04 MED ORDER — NORMAL SALINE FLUSH 0.9 % IV SOLN
0.9 | Freq: Two times a day (BID) | INTRAVENOUS | Status: DC
Start: 2024-03-04 — End: 2024-03-04

## 2024-03-04 MED ORDER — MAGNESIUM SULFATE 2000 MG/50 ML IVPB PREMIX
2 | INTRAVENOUS | Status: DC | PRN
Start: 2024-03-04 — End: 2024-03-04

## 2024-03-04 MED ORDER — POTASSIUM CHLORIDE CRYS ER 20 MEQ PO TBCR
20 | ORAL | Status: DC | PRN
Start: 2024-03-04 — End: 2024-03-04

## 2024-03-04 MED ORDER — ACETAMINOPHEN 650 MG RE SUPP
650 | Freq: Four times a day (QID) | RECTAL | Status: DC | PRN
Start: 2024-03-04 — End: 2024-03-04

## 2024-03-04 MED ORDER — ACETAMINOPHEN 325 MG PO TABS
325 | Freq: Four times a day (QID) | ORAL | Status: DC | PRN
Start: 2024-03-04 — End: 2024-03-04

## 2024-03-04 MED ORDER — POTASSIUM BICARB-CITRIC ACID 20 MEQ PO TBEF
20 | ORAL | Status: DC | PRN
Start: 2024-03-04 — End: 2024-03-04

## 2024-03-04 MED ORDER — POTASSIUM CHLORIDE 10 MEQ/100ML IV SOLN
10 | INTRAVENOUS | Status: DC | PRN
Start: 2024-03-04 — End: 2024-03-04

## 2024-03-04 MED ORDER — LISINOPRIL 5 MG PO TABS
5 | ORAL_TABLET | Freq: Every day | ORAL | 0 refills | 90.00000 days | Status: DC
Start: 2024-03-04 — End: 2024-04-21

## 2024-03-04 MED ORDER — SODIUM CHLORIDE 0.9 % IV SOLN
0.9 | INTRAVENOUS | Status: DC
Start: 2024-03-04 — End: 2024-03-04
  Administered 2024-03-04: 05:00:00 via INTRAVENOUS

## 2024-03-04 MED ORDER — ONDANSETRON 4 MG PO TBDP
4 | Freq: Three times a day (TID) | ORAL | Status: DC | PRN
Start: 2024-03-04 — End: 2024-03-04

## 2024-03-04 MED FILL — LISINOPRIL 2.5 MG PO TABS: 2.5 mg | ORAL | Qty: 1

## 2024-03-04 NOTE — Plan of Care (Signed)
 Problem: Discharge Planning  Goal: Discharge to home or other facility with appropriate resources  Outcome: Progressing

## 2024-03-04 NOTE — H&P (Signed)
 [facility]  CDU / OBSERVATION ENCOUNTER  Physician NOTE     Pt Name: Luis Santiago  MRN: [id number]  Birthdate [birth year]  Date of evaluation: 03/04/24  Patient's PCP is :  Quin Dagoberto BIRCH, APRN - NP    CHIEF COMPLAINT       Chief Complaint   Patient presents with    Hypotension      I was dizzy, I feel fine now    HISTORY OF PRESENT ILLNESS    Luis Santiago is a 53 y.o. male who presents for episode of syncope and collapse at home    Location/Symptom: Feeling faint and sweaty, losing consciousness without head strike, several moments of shaking for unspecified amount of time, hypotension  Timing/Onset: Sudden onset  Provocation: None  Quality: Not followed by confusion or memory  Radiation: Involved full body  Severity: Debilitating  Timing/Duration: Several minutes  Modifying Factors: None, glucose normal    History was obtained in part through review of the ED chart. When possible, a direct discussion was had with ED nurses, residents, and attendings  REVIEW OF SYSTEMS       General ROS - No fevers, No malaise   Ophthalmic ROS - No discharge, No changes in vision  ENT ROS -  No sore throat, No rhinorrhea,   Respiratory ROS - no shortness of breath, no cough, no  wheezing  Cardiovascular ROS - No chest pain, no dyspnea on exertion  Gastrointestinal ROS - No abdominal pain, no nausea or vomiting, no change in bowel habits, no black or bloody stools  Genito-Urinary ROS - No dysuria, trouble voiding, or hematuria  Musculoskeletal ROS - No myalgias, No arthalgias  Neurological ROS - No headache, no dizziness/lightheadedness, No focal weakness, no loss of sensation  Dermatological ROS - No lesions, No rash     (PQRS) Advance directives on face sheet per hospital policy. No change unless specifically mentioned in chart    PAST MEDICAL HISTORY    has a past medical history of Hypertension.    I have reviewed the past medical history with the patient and it is pertinent to this complaint.      SURGICAL  HISTORY      has no past surgical history on file.  I have reviewed and agree with Surgical History entered and it is not pertinent to this complaint.     CURRENT MEDICATIONS     lisinopril  (PRINIVIL ;ZESTRIL ) tablet 2.5 mg, Daily  sodium chloride  flush 0.9 % injection 5-40 mL, 2 times per day  sodium chloride  flush 0.9 % injection 5-40 mL, PRN  0.9 % sodium chloride  infusion, PRN  potassium chloride  (KLOR-CON  M) extended release tablet 40 mEq, PRN   Or  potassium bicarb-citric acid  (EFFER-K) effervescent tablet 40 mEq, PRN   Or  potassium chloride  10 mEq/100 mL IVPB (Peripheral Line), PRN  magnesium  sulfate 2000 mg in 50 mL IVPB premix, PRN  ondansetron  (ZOFRAN -ODT) disintegrating tablet 4 mg, Q8H PRN   Or  ondansetron  (ZOFRAN ) injection 4 mg, Q6H PRN  polyethylene glycol (GLYCOLAX ) packet 17 g, Daily PRN  acetaminophen  (TYLENOL ) tablet 650 mg, Q6H PRN   Or  acetaminophen  (TYLENOL ) suppository 650 mg, Q6H PRN  0.9 % sodium chloride  infusion, Continuous        All medication charted and reviewed.    ALLERGIES     is allergic to dilantin [phenytoin].      FAMILY HISTORY     has no family status information on file.  family history is not on file.  The patient denies any pertinent family history.  I have reviewed and agree with the family history entered.  I have reviewed the Family History and it is not significant to the case    SOCIAL HISTORY      reports that he has never smoked. He has never used smokeless tobacco. He reports that he does not use drugs.  I have reviewed and agree with all Social.  There are no concerns for substance abuse/use.    PHYSICAL EXAM     INITIAL VITALS:  height is 1.778 m (5' 10) and weight is 87.6 kg (193 lb 2 oz). His oral temperature is 98.2 F (36.8 C). His blood pressure is 121/76 and his pulse is 60. His respiration is 18 and oxygen saturation is 100%.      CONSTITUTIONAL: AOx4, no apparent distress, appears stated age    HEAD: normocephalic, atraumatic   EYES: PERRLA, EOMI     ENT: moist mucous membranes, uvula midline   NECK: supple, symmetric   BACK: symmetric   LUNGS: clear to auscultation bilaterally   CARDIOVASCULAR: regular rate and rhythm, no murmurs, rubs or gallops   ABDOMEN: soft, non-tender, non-distended with normal active bowel sounds   NEUROLOGIC:  MAEx4, no focal sensory or motor deficits   MUSCULOSKELETAL: no clubbing, cyanosis or edema, strength equal and appropriate in all 4 extremity   SKIN: no rash or wounds       DIFFERENTIAL DIAGNOSIS/MDM:     FROM ED MEDICAL DECISION MAKING NOTE:   Luis Santiago 53 year old male, history of HTN on Lisinopril  5 mg, who presents to the emergency department via EMS for episode of unconsciousness. According to patient, prior to arrival he was sitting outside with his friends when he suddenly felt faint and sweaty and then had a loss of consciousness.  EMS was called to the scene no hypoglycemic episode recorded, was hypotensive and received fluids and route.     Chart review reveals documented history of prediabetes, hypoglycemic episode 12/28/2023 seen at [facility] emergency department, and previous treadmill stress test and echocardiogram on 08/30/2023.     Given history and clinical state CMP, CBC, and magnesium  were obtained.      On reevaluation patient notes that he went to the bathroom and legs felt weaker which was not present prior to loss of consciousness episode. [KM]    2339 Discussed with patient lab results and unclear cause of syncope given no hypoglycemic episodes or metabolic abnormalities.  Given prodrome of sweating and feeling faint prior to episode concern for underlying cardiac etiology in addition feeling weak since episode, discussed option with patient to stay for observation with cardiology consult in the morning or follow-up with PCP outpatient.  Patient expressed concern that he would not follow-up with PCP and/or additional specialists outpatient, shared decision that observation with cardiology consult would  be best for patient circumstances.       DIAGNOSTIC RESULTS     EKG: All EKG's are interpreted by the Observation Physician who either signs or Co-signs this chart in the absence of a cardiologist.    EKG Interpretation    No new EKG since admission to observation unit from the emergency department    RADIOLOGY:   I directly visualized the following  images and reviewed the radiologist interpretations:    No results found.    LABS:  I have reviewed and interpreted all available lab results.  Labs Reviewed   CBC WITH  AUTO DIFFERENTIAL - Abnormal; Notable for the following components:       Result Value    RBC 3.52 (*)     Hemoglobin 10.9 (*)     Hematocrit 33.1 (*)     Eosinophils % 6 (*)     All other components within normal limits   COMPREHENSIVE METABOLIC PANEL - Abnormal; Notable for the following components:    Sodium 135 (*)     Glucose 203 (*)     Calcium 8.2 (*)     All other components within normal limits   COMPREHENSIVE METABOLIC PANEL W/ REFLEX TO MG FOR LOW K - Abnormal; Notable for the following components:    Calcium 8.4 (*)     All other components within normal limits   CBC WITH AUTO DIFFERENTIAL - Abnormal; Notable for the following components:    RBC 3.57 (*)     Hemoglobin 11.1 (*)     Hematocrit 34.1 (*)     Monocytes % 17 (*)     Eosinophils % 5 (*)     All other components within normal limits   MAGNESIUM          CDU IMPRESSION / PLAN      Luis Santiago is a 53 y.o. male who presents with witnessed syncopal episode without head strike at home    Cardiology consult  Continue home medications and pain control  Monitor vitals, labs, and imaging  DISPO: pending consults and clinical improvement    CONSULTS:    IP CONSULT TO CARDIOLOGY    PROCEDURES:  Not indicated       PATIENT REFERRED TO:    No follow-up provider specified.    --  Luis CHRISTELLA Hock, MD   Observation Physician    This dictation was generated by voice recognition computer software.  Although all attempts are made to edit the  dictation for accuracy, there may be errors in the transcription that are not intended.

## 2024-03-04 NOTE — Discharge Instructions (Signed)
 You were admitted to the hospital after you had a witnessed syncopal event, the fancy term for having passed out.  We checked a bunch of your labs and looked at your heart to make sure that you were safe to go home.  It does not look like you had any lab problems that would explain why you passed out, such as low blood sugar.      You have some instructions from cardiology:   -Stop taking your lisinopril  unless you are blood pressure is over 140.  Please purchase an electronic blood pressure cuff to check your blood pressure at home.  Only take your lisinopril  on days where the blood pressure is over 140.  -Start wearing compression socks at home and when you go out  -Stop drinking soda pop and instead drink both more water and more low sugar electrolyte drinks

## 2024-03-04 NOTE — Discharge Summary (Signed)
 CDU Discharge Summary        Patient:  Luis Santiago  Date of Birth: Mar 12, 1971    MRN: [id number]   Acct: [id number]    Primary Care Physician: Quin Dagoberto BIRCH, APRN - NP    Admit date:  03/03/2024  8:50 PM  Discharge date: 03/04/2024 12:15 PM     Discharge Diagnoses:     1.)  Patient had witnessed syncopal episode with prodrome of sweating and lightheadedness, no head strike.  Had a metabolic and cardiac workup.  Patient's open stable.  Plan to discharge with external loop recorder, per cardiology    Follow-up: Await phone call from Dr. Hildegard cardiology office in the next several days.  Phone number also provided in discharge instructions in case you do not hear from them.  Return to the emergency department with any repeat or new worsening symptoms.    Stressed to patient the importance of following up with primary care doctor for further workup/management of symptoms.  Pt verbalizes understanding and agrees with plan.  Patient's phone number was confirmed in the chart    Discharge Medication Changes:       Medication List        CHANGE how you take these medications      lisinopril  5 MG tablet  Commonly known as: PRINIVIL ;ZESTRIL   Take 0.5 tablets by mouth daily Only take this medication when your systolic blood pressure (the top number) is 140 or higher.  What changed: additional instructions               Where to Get Your Medications        These medications were sent to [id number] - [place], [place] - [address] - P [phone] - F [phone]  [address], [place] [address]      Hours: 24-hours Phone: [phone]   lisinopril  5 MG tablet         Diet:  ADULT DIET; Regular, advance as tolerated     Activity:  As tolerated    Consultants: IP CONSULT TO CARDIOLOGY    Procedures:  Not indicated     Diagnostic Test:   Results for orders placed or performed during the hospital encounter of 03/03/24   CBC with Auto Differential   Result Value Ref Range    WBC 3.8 3.5 - 11.3 k/uL    RBC 3.52 (L)  4.21 - 5.77 m/uL    Hemoglobin 10.9 (L) 13.0 - 17.0 g/dL    Hematocrit 66.8 (L) 40.7 - 50.3 %    MCV 94.0 82.6 - 102.9 fL    MCH 31.0 25.2 - 33.5 pg    MCHC 32.9 28.4 - 34.8 g/dL    RDW 86.6 88.1 - 85.5 %    Platelets 188 138 - 453 k/uL    MPV 10.3 8.1 - 13.5 fL    NRBC Automated 0.0 0.0 per 100 WBC    Neutrophils % 47 36 - 65 %    Lymphocytes % 35 24 - 43 %    Monocytes % 12 3 - 12 %    Eosinophils % 6 (H) 1 - 4 %    Basophils % 0 0 - 2 %    Immature Granulocytes % 0 0 %    Neutrophils Absolute 1.75 1.50 - 8.10 k/uL    Lymphocytes Absolute 1.33 1.10 - 3.70 k/uL    Monocytes Absolute 0.47 0.10 - 1.20 k/uL    Eosinophils Absolute 0.22 0.00 -  0.44 k/uL    Basophils Absolute <0.03 0.00 - 0.20 k/uL    Immature Granulocytes Absolute <0.03 0.00 - 0.30 k/uL   CMP   Result Value Ref Range    Sodium 135 (L) 136 - 145 mmol/L    Potassium 3.8 3.7 - 5.3 mmol/L    Chloride 101 98 - 107 mmol/L    CO2 24 20 - 31 mmol/L    Anion Gap 10 9 - 16 mmol/L    Glucose 203 (H) 74 - 99 mg/dL    BUN 16 6 - 20 mg/dL    Creatinine 1.1 0.7 - 1.2 mg/dL    Est, Glom Filt Rate 79 >60 mL/min/1.29m2    Calcium 8.2 (L) 8.6 - 10.4 mg/dL    Total Protein 6.7 6.6 - 8.7 g/dL    Albumin 3.9 3.5 - 5.2 g/dL    Albumin/Globulin Ratio 1.4 1.0 - 2.5    Total Bilirubin 0.3 0.0 - 1.2 mg/dL    Alkaline Phosphatase 94 40 - 129 U/L    ALT 49 10 - 50 U/L    AST 32 10 - 50 U/L   Magnesium    Result Value Ref Range    Magnesium  2.0 1.6 - 2.6 mg/dL   Comprehensive Metabolic Panel w/ Reflex to MG   Result Value Ref Range    Sodium 139 136 - 145 mmol/L    Potassium 3.9 3.7 - 5.3 mmol/L    Chloride 106 98 - 107 mmol/L    CO2 24 20 - 31 mmol/L    Anion Gap 9 9 - 16 mmol/L    Glucose 91 74 - 99 mg/dL    BUN 12 6 - 20 mg/dL    Creatinine 0.9 0.7 - 1.2 mg/dL    Est, Glom Filt Rate >90 >60 mL/min/1.53m2    Calcium 8.4 (L) 8.6 - 10.4 mg/dL    Total Protein 6.7 6.6 - 8.7 g/dL    Albumin 3.7 3.5 - 5.2 g/dL    Albumin/Globulin Ratio 1.2 1.0 - 2.5    Total Bilirubin 0.3 0.0 - 1.2  mg/dL    Alkaline Phosphatase 91 40 - 129 U/L    ALT 43 10 - 50 U/L    AST 26 10 - 50 U/L   CBC with Auto Differential   Result Value Ref Range    WBC 4.2 3.5 - 11.3 k/uL    RBC 3.57 (L) 4.21 - 5.77 m/uL    Hemoglobin 11.1 (L) 13.0 - 17.0 g/dL    Hematocrit 65.8 (L) 40.7 - 50.3 %    MCV 95.5 82.6 - 102.9 fL    MCH 31.1 25.2 - 33.5 pg    MCHC 32.6 28.4 - 34.8 g/dL    RDW 86.5 88.1 - 85.5 %    Platelets 200 138 - 453 k/uL    MPV 10.3 8.1 - 13.5 fL    NRBC Automated 0.0 0.0 per 100 WBC    Neutrophils % 48 36 - 65 %    Lymphocytes % 29 24 - 43 %    Monocytes % 17 (H) 3 - 12 %    Eosinophils % 5 (H) 1 - 4 %    Basophils % 1 0 - 2 %    Immature Granulocytes % 0 0 %    Neutrophils Absolute 2.02 1.50 - 8.10 k/uL    Lymphocytes Absolute 1.22 1.10 - 3.70 k/uL    Monocytes Absolute 0.69 0.10 - 1.20 k/uL    Eosinophils Absolute 0.21 0.00 -  0.44 k/uL    Basophils Absolute <0.03 0.00 - 0.20 k/uL    Immature Granulocytes Absolute <0.03 0.00 - 0.30 k/uL   Troponin   Result Value Ref Range    Troponin, High Sensitivity <6 0 - 22 ng/L   EKG 12 Lead   Result Value Ref Range    Ventricular Rate 82 BPM    Atrial Rate 85 BPM    P-R Interval 168 ms    QRS Duration 92 ms    Q-T Interval 426 ms    QTc Calculation (Bazett) 497 ms    P Axis 63 degrees    R Axis 56 degrees    T Axis 70 degrees     No results found.        Physical Exam:    General appearance - NAD, AOx 3   Lungs -CTAB, no R/R/R  Heart - RRR, no M/R/G  Abdomen - Soft, NT/ND  Neurological -  MAEx4, No focal motor deficit, sensory loss  Extremities - Cap refil <2 sec in all ext., no edema  Skin -warm, dry      Hospital Course:  Clinical course has improved, labs and imaging reviewed.     Luis Santiago originally presented to the hospital on 03/03/2024  8:50 PM after a witnessed syncopal event, no head strike.  At that time it was determined that He required further observation and evaluation by cardiology. Labs and imaging were followed daily.  Imaging results as above.  He is  medically stable to be discharged.     New instructions per cardiology, which were given to patient:  - Await phone call from their office for outpatient cardiology follow-up and placement of cardiac monitor  - Stop taking your home lisinopril .  Purchase a home blood pressure cuff and measure blood pressure every morning.  On mornings where your blood pressure top number is over 140, take your lisinopril   - Wear compression stockings  - Stop drinking soda pop.  Drink more water and low sugar electrolyte drinks instead.      Disposition: Home    Patient stated that they will not drive themselves home from the hospital if they have gotten pain killers/ narcotics earlier that day and that they will arrange for transportation on their own or work with the case manager for a ride. Patient counseled NOT to drive while under the influence of narcotics/ pain killers.     Condition: Good    Patient stable and ready for discharge home. I have discussed plan of care with patient and they are in understanding. They were instructed to read discharge paperwork. All of their questions and concerns were addressed.     Time Spent: 0 day    --  Silvano CHRISTELLA Hock, MD MPH  Observation Physician    This dictation was generated by voice recognition computer software.  Although all attempts are made to edit the dictation for accuracy, there may be errors in the transcription that are not intended.

## 2024-03-04 NOTE — Consults (Addendum)
 Attestation signed by      Attending Physician Statement:    I have discussed the care of  Luis Santiago , including pertinent history and exam findings, with the Cardiology fellow/resident.     I have seen and examined the patient and the key elements of all parts of the encounter have been performed by me. I agree with the assessment, plan and orders as documented by the fellow/resident, after I modified exam findings and plan of treatments, and the final version is my approved version of the assessment.     Additional Comments: Syncope. EMS noticed BP into 90/50. He takes lisinopril . DC lisinopril . Monitor BP at home.  Liberalize oral fluid intake.  Compression socks knee-high and off while sleeping.  Recent echocardiogram and stress test were unremarkable.  Recommend outpatient Holter monitoring for 7 days.    Leni Wenona Lash, MD               [facility]    Consult / H&P               Today's Date: 03/04/2024  Patient Name: Luis Santiago  Date of admission: 03/03/2024  8:50 PM  Patient's age: 53 y.o., Oct 30, 1970  Admission Dx: Syncope and collapse [R55]    Reason for Consult:  Cardiac evaluation    Requesting Physician: Alm JINNY Corona, MD    CHIEF COMPLAINT:  I passed out    History Obtained From:  patient and EMR    HISTORY OF PRESENT ILLNESS:      The patient is a 53 y.o. African American male who is admitted to the hospital for Syncope last night while he was at home sitting outside drinking soda. Patient states the episode lasted approximately 2 minutes and his friends witnessed him lose consciousness and his body started shaking. He states he woke up not confused, but sweaty. EMS was called and found patient to be hypotensive. Patient states he is mostly compliant with his BP medication but has not taken his lisinopril  at home today.  He denies any previous history of syncopal or near syncopal events. He denies any aura or visual symptoms prior to or immediately after the event. He  denies any changes to his hearing prior to or after the event. He denies a feeling of fluttering, palpitations, or that his heart was pounding prior to, during, or after the event. Patient believes his fluid intake the last couple of days has been normal and denies feeling dehydrated or extra thirsty.    Patient complains of syncope. Onset was 12 hours ago. Symptoms have  been stable since that time. Patient describes the episode as lost consciousness for 2 minutes, syncopal episode occurred during normal non-stressful ADLs, and episode witnessed and the following was observed: mental status was normal immediately upon regaining consciousness, tonic-clonic activity noted, and excessive sweating noted.  Associated symptoms: general feeling of lightheadedness. The patient denies abdominal pain, diarrhea, excessive thirst, headache, and visual aura. Medications putting patient at risk for syncope: hypotensive medications.    Past Medical History:  HTN, pre-diabetes.    Past Surgical History:   has no past surgical history on file.     Home Medications:    Prior to Admission medications    Medication Sig Start Date End Date Taking? Authorizing Provider   lisinopril  (PRINIVIL ;ZESTRIL ) 5 MG tablet Take 0.5 tablets by mouth daily 04/26/22 05/26/22  Gretel Rush T, DO      Current Facility-Administered Medications: lisinopril  (PRINIVIL ;ZESTRIL ) tablet 2.5 mg,  2.5 mg, Oral, Daily  sodium chloride  flush 0.9 % injection 5-40 mL, 5-40 mL, IntraVENous, 2 times per day  sodium chloride  flush 0.9 % injection 5-40 mL, 5-40 mL, IntraVENous, PRN  0.9 % sodium chloride  infusion, , IntraVENous, PRN  potassium chloride  (KLOR-CON  M) extended release tablet 40 mEq, 40 mEq, Oral, PRN **OR** potassium bicarb-citric acid  (EFFER-K) effervescent tablet 40 mEq, 40 mEq, Oral, PRN **OR** potassium chloride  10 mEq/100 mL IVPB (Peripheral Line), 10 mEq, IntraVENous, PRN  magnesium  sulfate 2000 mg in 50 mL IVPB premix, 2,000 mg, IntraVENous,  PRN  ondansetron  (ZOFRAN -ODT) disintegrating tablet 4 mg, 4 mg, Oral, Q8H PRN **OR** ondansetron  (ZOFRAN ) injection 4 mg, 4 mg, IntraVENous, Q6H PRN  polyethylene glycol (GLYCOLAX ) packet 17 g, 17 g, Oral, Daily PRN  acetaminophen  (TYLENOL ) tablet 650 mg, 650 mg, Oral, Q6H PRN **OR** acetaminophen  (TYLENOL ) suppository 650 mg, 650 mg, Rectal, Q6H PRN  0.9 % sodium chloride  infusion, , IntraVENous, Continuous    Allergies:  Dilantin [phenytoin]    Social History:   reports that he has never smoked. He has never used smokeless tobacco. He reports that he does not use drugs.     Family History: family history is not on file. No h/o sudden cardiac death.No for premature CAD    REVIEW OF SYSTEMS:    Constitutional: there has been no unanticipated weight loss. There's been No change in energy level, No change in activity level.     Eyes: No visual changes or diplopia. No scleral icterus.  ENT: No Headaches  Cardiovascular: No cardiac history  Respiratory: No previous pulmonary problems, No cough  Gastrointestinal: No abdominal pain.  No change in bowel or bladder habits.  Genitourinary: No dysuria, trouble voiding, or hematuria.  Musculoskeletal:  No gait disturbance, No weakness or joint complaints.  Integumentary: No rash or pruritis.  Neurological: No headache, diplopia, change in muscle strength, numbness or tingling. No change in gait, balance, coordination, mood, affect, memory, mentation, behavior.  Psychiatric: No anxiety, or depression.  Endocrine: No temperature intolerance. No excessive thirst, fluid intake, or urination. No tremor.  Hematologic/Lymphatic: No abnormal bruising or bleeding, blood clots or swollen lymph nodes.  Allergic/Immunologic: No nasal congestion or hives.      PHYSICAL EXAM:      BP (!) 130/97   Pulse 89   Temp 97.9 F (36.6 C) (Oral)   Resp 18   Ht 1.778 m (5' 10)   Wt 87.6 kg (193 lb 2 oz)   SpO2 100%   BMI 27.71 kg/m    Constitutional and General Appearance: alert,  cooperative, no distress and appears stated age  HEENT: PERRL, no cervical lymphadenopathy. No masses palpable. Normal oral mucosa  Respiratory:  Normal excursion and expansion without use of accessory muscles  Resp Auscultation: Good respiratory effort. No for increased work of breathing. On auscultation: clear to auscultation bilaterally  Cardiovascular:  The apical impulse is not displaced  Heart tones are crisp and normal. regular S1 and S2.  Jugular venous pulsation Normal  The carotid upstroke is normal in amplitude and contour without delay or bruit  Peripheral pulses are symmetrical and full   Abdomen:   No masses or tenderness  Bowel sounds present  Extremities:   No Cyanosis or Clubbing   Lower extremity edema: No   Skin: Warm and dry  Neurological:  Alert and oriented.  Moves all extremities well  No abnormalities of mood, affect, memory, mentation, or behavior are noted    DATA:  Diagnostics:    EKG: normal sinus rhythm, prolonged QT interval, unchanged from previous tracings.    EKG Interpretation     Interpreted by Fredericka Pac, DO      Rhythm: normal sinus   Rate: normal  Axis: normal  Ectopy: none  Conduction: Prolonged QTc  ST Segments: no acute change  T Waves: no acute change  Q Waves: none    ECHO: previously taken 6 months prior and show EF 50-55% without wall motion abnormalities or global hypokinesis.      Examination: Echocardiogram     (Complete) Image Quality: Fair Patient Consent: Procedure explained to     patient Conclusions Left Ventricle: The left ventricle is normal size.     Global left ventricular systolic function is at lower limits of     normal. EF range is estimated at 50 % -55 %. Left ventricular wall     thickness is normal. No regional wall motion abnormality. Right     Ventricle: The right ventricle is normal in size. Normal right     ventricular systolic function. Doppler studies suggest normal right     sided pressures. Left Atrium: The left atrium is normal in size.      Measurements Left Ventricle Label Value Normal Value LVOT PGmax 3 mmHg     LVDd, 2D 4.39 cm (4.2cm - 5.9cm) LVDs, 2D 3.12 cm (2.1cm - 4cm) IVSd,     2D 1.22 cm (0.6cm - 1.1cm) LVPWd, 2D 0.99 cm (0.6cm - 1cm) LV Mass, 2D     ASE 169.41 g LV Mass Index, 2D ASE 82.6 g/m?? (50g/m?? - 102.4g/m??) RWT,     MM 0.45 (0 - 0.42) LVSVI, 2D 23.9 ml/m2 Right Ventricle Label Value     Normal Value RVDd, 2D 3.59 cm (1.9cm - 3.8cm) TAPSE 1.89 cm Right     Atrium Label Value Normal Value RA Area 17.1 cm?? Aortic Valve Label     Value Normal Value AV DVI 0.81 Mitral Valve Label Value Normal Value     MV E Vmax 0.61 m/s MV A Vmax 0.67 m/s MV E/A 0.91 MV E/E' lateral 5.5     MV E' lateral 0.11 m/s Tricuspid Valve Label Value Normal Value RA     Pressure 3 mmHg RVSP 26 mmHg TR Vmax 2.4 m/s Aorta Label Value Normal     Value AoRoot, 2D 3.6 cm (1.4cm - 3.8cm) Valvular Assessment LVOT 0.7 -     1.1 m/sec Aortic Valve 1.0 - 1.7 m/sec Mitral Valve 0.6 - 1.3 m/sec     Tricuspid Valve 0.3 - 0.7 m/sec Pulmonic Valve 0.6 - 0.9 m/sec     Regurgitation No Trivial Trivial No Max Velocity 0.86 m/sec 1.06 m/s     0.61 m/sec Max Gradient 4.00 mmHg Findings Left Ventricle: The left     ventricle is normal size. Global left ventricular systolic function is     at lower limits of normal. EF range is estimated at 50 % -55 %. Left     ventricular wall thickness is normal. No regional wall motion     abnormality. Right Ventricle: The right ventricle is normal in size.     Normal right ventricular systolic function. Doppler studies suggest     normal right sided pressures. Left Atrium: The left atrium is normal     in size. Right Atrium: The right atrium is normal in size. Mitral     Valve: There is nonspecific thickening of the mitral valve leaflet.  Trivial mitral regurgitation. Aortic Valve: The aortic valve is     normal. No aortic valve regurgitation. Tricuspid Valve: Normal     tricuspid valve. Trivial tricuspid regurgitation. Pulmonic Valve:      Normal pulmonary valve. No pulmonary regurgitation. Aorta: The aortic     root exhibits normal size. Great Vessels: IVC: The inferior vena cava     is poorly visualized. Pericardium: No pericardial effusion. Procedure     Staff Reading Group: [facility] Sonographer: Maddie       Stress Test: previously taken 6 months prior and show no reversible ischemia and low risk stratification.    Examination: Treadmill Stress Myocardial Perfusion Imaging Image     Quality: Excellent Patient Consent: Procedure explained to patient,     information sheet reviewed Clinical Data Smoking: Current or Recent     Smoker of &lt; 1 year Hypertension: Yes Dyslipidemia: Yes Physical     Activity: Light level of activity Chest Pain: Yes Dyspnea: No     Medication Category Medication Name Dose Comment General Medications     Losartan/Hydrochlorothiazide (Hyzaar) Conclusions Negative perfusion     stress test for ischemia, Normal myocardial perfusion with soft tissue     artifact, Normal global left ventricular function, No transient     ischemia dilatation. Normal treadmill ECG stress test and The Duke     Score 9 consistent with low risk estimates an annual cardiovascular     mortality of 0% and a five year survival of 95% Using the Duke Score     there is a low probability of any angiographic coronary disease.     Findings Technique Tc67m Sestamibi with a dose of 11.9 mCi was     injected intravenously 1 hour prior to rest imaging. Tc99m Sestamibi     with a dose of 35.7 mCi was injected intravenously 1 minute prior to     termination of exercise A One-Day SPECT Rest/Gated Stress protocol was     performed. Computer assisted reconstruction was performed by standard     Gated SPECT techniques for the generation of reconstructed horizontal,     vertical long-axis, and short axis projections. Patients are given a     caffeinated beverage after stress portion of test. Outpatients are     also given a light snack. Stress Test  Findings The patient exercised     for 09:27 minutes by the Bruce protocol and achieved a heart rate of     146 bpm or 87 % of the maximal predicted heart rate. The stress     exercise was terminated due to Stress termination reason: Leg     discomfort. Heart rate response to exercise was Appropriate heart rate     response. The resting blood pressure was 142/97 and increased to     178/78, which is a Resting hypertension - appropriate response (rest -     exercise). The resting ECG demonstrates: Normal. The patient had no     chest pain during the study. There were no ST changes at maximal     exercise. Perfusion and Wall Motion Findings The transient ischemic     dilation (TID) ratio is 0.89. The gated stress calculated left     ventricular ejection fraction is 64%. Perfusion after stress: The     distribution of Tc37m Sestamibi following exercise is normal.     Perfusion at rest: The distribution of Tc3m Sestamibi at rest is     normal. Wall motion with  stress: All remaining scored wall segments     are normal. s sp@c  Nuclear Protocol Details Stress Examination Details     Type of Stress: Bruce Exercise Time: 09:27 Device: Treadmill HR     Reserve Used: 78.00 % HR Recovery: 22 bpm Frequent VE: 0 VE/min     Resolution: No Symptoms Max HR: 146 bpm Target HR: 141 bpm   Achieved:     Yes Resting HR: 83 bpm Max Predicted HR: 166 bpm Achv. of Max     Predicted: 87 % BP Max: 178/78 BP at Rest: 142/97 Max RPP: 23068     mmHg*bpm Max ST Lead: V5 Max ST Phase: Exercise Stage No. in Phase: 4     Max ST Stage: Stage 4 Max ST Amplitude: -1.250 mm Max ST Slope: 1.000     mV/s Max ST Time in Phase: 09:27 Artifact Count: 7 PSVC Count: 1 Chest     Pain: no Comments: FAI male sedentary (-9) Physical Stress Examination     Protocol Stage Name Time in Stage Speed Grade Load Heart Rate BP ST     Level Cardiac Arrhy Cardiac Symp. Other Pain Changes Symptom Supine     00:21 0.00 mph 0.00 % 1.00 mets 65 bpm 145/86 0.800 mm Standing  03:35     1.00 mph 0.00 % 1.70 mets 83 bpm 142/97 0.750 mm Stage 1 03:00 1.70     mph 10.00 % 4.60 mets 100 bpm 134/82 0.500 mm Stage 2 03:00 2.50 mph     12.00 % 7.00 mets 109 bpm 150/88 0.200 mm Stage 3 03:00 3.40 mph 14.00     % 10.10 mets 141 bpm 158/88 -1.050 mm Stage 4 00:27 4.20 mph 16.00 %     10.80 mets 144 bpm 158/88 -1.250 mm Peak-53min 02:00 0.00 mph 0.00 %     1.10 mets 108 bpm 178/78 -0.150 mm 2-4 min 02:00 0.00 mph 0.00 % 1.10     mets 102 bpm 165/93 -0.200 mm 4-6 min 02:00 0.00 mph 0.00 % 1.10 mets     100 bpm 157/89 0.100 mm 6-8 min 02:00 0.00 mph 0.00 % 1.10 mets 97 bpm     163/94 0.100 mm 8-10 min 02:00 0.00 mph 0.00 % 1.10 mets 92 bpm 156/97     0.400 mm 10-12 min 00:30 0.00 mph 0.00 % 1.10 mets 86 bpm 156/97 0.450     mm Assessment HR Response: Appropriate heart rate response BP     Response: Resting hypertension - appropriate response Terminate     Reason: Stress termination reason: Leg discomfort Resting ECG: Normal     ST Changes: no Chest Pain: no Arrhythmias: Arrhythmias: None seen     Functional Capacity: Functional capacity is normal (-20 - +19%)     Exercise Interpretation: Low risk of cardiovascular event Overall     Impression: Overall interpretation: Normal ECG stress test Region     Quantitation LAD Circumflex RCA Total Summed Score Stress 0 0 0 0     Difference 0 Rest 0 0 0 0 Ischemia 0 0 0 0 Regional Score Total 0 0 0     Stress/Rest Perfusion: Rest Stress/Redistribution Difference 0 -     Normal, 1 - Mild Reduction, 2 - Moderate Reduction, 3 - Severe     Reduction, 4 - Absent Uptake LV Function: REST STRESS 0 - Normal, 1 -     Hypokinesia, 2 - Akinesia, 3 - Dyskinesia, 4 - Aneurysmal Procedure     Labs:  CBC:   Recent Labs     03/03/24  2141   WBC 3.8   HGB 10.9*   HCT 33.1*   PLT 188     BMP:   Recent Labs     03/03/24  2141   NA 135*   K 3.8   CO2 24   BUN 16   CREATININE 1.1   LABGLOM 79   GLUCOSE 203*       CARDIAC ENZYMES:   Lab Results   Component Value Date    TROPHS < 6  04/26/2022       FASTING LIPID PANEL:No results found for: HDL, LDLDIRECT, TRIG  LIVER PROFILE:  Recent Labs     03/03/24  2141   AST 32   ALT 49       IMPRESSION:    Witnessed Syncopal without traumatic injury  DDX: Psychogenic seizure, Vasovagal syncope, Stroke, Nonepileptic seizure, Cardiogenic Syncope.       Patient Active Problem List   Diagnosis    Benign prostatic hyperplasia without lower urinary tract symptoms    Costochondritis    Dyslipidemia    Essential hypertension    Meniscus, lateral, posterior horn derangement, left    Prediabetes    Renal cyst    Syncope and collapse       RECOMMENDATIONS:  External Holter monitor on discharge  Repeat EKG q 12 while admitted  Orthostatic Vitals  Maintain potassium above 4 and Magnesium  above 2.  Rest of management per primary      Discussed with patient and Nurse.    Electronically signed by JHON JOSEFA SAUNDERS on 03/04/2024 at 6:49 AM    [facility]      [phone]

## 2024-03-04 NOTE — Progress Notes (Signed)
 [place] [facility]  CDU / OBSERVATION ENCOUNTER  ATTENDING NOTE         I performed a history and physical examination of the patient and discussed management with the resident or midlevel provider. I reviewed the resident or midlevel provider's note and agree with the documented findings and plan of care. Any areas of disagreement are noted on the chart. I was personally present for the key portions of any procedures. I have documented in the chart those procedures where I was not present during the key portions. I have reviewed the nurses notes. I agree with the chief complaint, past medical history, past surgical history, allergies, medications, social and family history as documented unless otherwise noted below.    The Family history, social history, and ROS are effectively unchanged since admission unless noted elsewhere in the chart.     This patient was placed in the observation unit for reevaluation for possible admission to the hospital     Patient admitted for cardiology evaluation.  Patient with hypotensive episode.  Patient felt to require further outpatient monitoring.  Patient for prolonged monitoring so arrangements to be made by cardiologist office for loop recorder versus long-term Holter monitor.  Patient comfortable at time of discharge.  Patient understands plan.  Patient understands for hydration.  Patient understands medication adjustments       Alm JINNY Corona MD  Attending Emergency  Physician

## 2024-03-05 ENCOUNTER — Ambulatory Visit: Admit: 2024-03-05 | Discharge: 2024-03-05 | Payer: BLUE CROSS/BLUE SHIELD

## 2024-03-05 ENCOUNTER — Telehealth

## 2024-03-05 DIAGNOSIS — R55 Syncope and collapse: Principal | ICD-10-CM

## 2024-03-05 NOTE — Telephone Encounter (Signed)
 I called patient to schedule holter monitor hook up. I left a voicemail. When patient calls back please arrange at any location that's closest for him.

## 2024-03-05 NOTE — Telephone Encounter (Signed)
 Location: [place]  Holter hooked up: 03/05/2024  Expected return date: 03/12/2024  Monitor ID: [id number]

## 2024-03-06 ENCOUNTER — Inpatient Hospital Stay
Admit: 2024-03-06 | Discharge: 2024-03-06 | Disposition: A | Discharge status: Home / Self Care | Payer: PRIVATE HEALTH INSURANCE | Arrived: WI | Attending: Emergency Medicine

## 2024-03-06 ENCOUNTER — Emergency Department: Admit: 2024-03-06

## 2024-03-06 NOTE — ED Provider Notes (Cosign Needed)
 [facility] EMERGENCY DEPARTMENT  EMERGENCY DEPARTMENT ENCOUNTER      Pt Name: Luis Santiago  MRN: [id number]  Birthdate 10-11-1970  Date of evaluation: 03/06/2024  Provider: Norman Breen, APRN - CNP  6:44 PM    CHIEF COMPLAINT       Chief Complaint   Patient presents with    Tachycardia     My hearts beating fast since last night; also has holter monitor, its dead; hospitalized 3 days ago for syncope/collapse         HISTORY OF PRESENT ILLNESS    Luis Santiago is a 53 y.o. male who presents to the emergency department presents today with he did Holter monitor that stopped working 11 AM.  Patient called his cardiology office who instructed him to come to the emergency department where he originally got his.  Patient denies any new symptoms, recently was discharged from [facility] 2 days ago for syncopal episode with an extensive cardiac workup that was negative.  Patient reiterates that he is here for Holter monitor that works.    HPI    Nursing Notes were reviewed.    REVIEW OF SYSTEMS       Review of Systems    Except as noted above the remainder of the review of systems was reviewed and negative.       PAST MEDICAL HISTORY     Past Medical History:   Diagnosis Date    Hypertension          SURGICAL HISTORY     No past surgical history on file.      CURRENT MEDICATIONS       Previous Medications    LISINOPRIL  (PRINIVIL ;ZESTRIL ) 5 MG TABLET    Take 0.5 tablets by mouth daily Only take this medication when your systolic blood pressure (the top number) is 140 or higher.       ALLERGIES     Dilantin [phenytoin]    FAMILY HISTORY     No family history on file.       SOCIAL HISTORY       Social History     Socioeconomic History    Marital status: Single   Tobacco Use    Smoking status: Never    Smokeless tobacco: Never   Vaping Use    Vaping status: Never Used   Substance and Sexual Activity    Drug use: Never     Social Drivers of Psychologist, prison and probation services Strain: Low Risk  (12/11/2023)    Received from [facility] [place]    Overall [employer] (CARDIA)     Difficulty of Paying Living Expenses: Not hard at all   Food Insecurity: No Food Insecurity (03/04/2024)    Hunger Vital Sign     Worried About Running Out of Food in the Last Year: Never true     Ran Out of Food in the Last Year: Never true   Transportation Needs: No Transportation Needs (03/04/2024)    PRAPARE - Therapist, art (Medical): No     Lack of Transportation (Non-Medical): No   Physical Activity: Sufficiently Active (12/11/2023)    Received from [facility]    Exercise Vital Sign     Days of Exercise per Week: 6 days     Minutes of Exercise per Session: 40 min   Stress: No Stress Concern Present (12/11/2023)    Received from [facility] [place]  [employer] of Occupational Health - Occupational Stress Questionnaire     Feeling of Stress : Only a little   Social Connections: Unknown (12/11/2023)    Received from [facility] [employer] and Isolation Panel [NHANES]     Frequency of Communication with Friends and Family: Once a week     Frequency of Social Gatherings with Friends and Family: Once a week     Attends Religious Services: 1 to 4 times per year     Active Member of [employer] or Organizations: Yes     Attends Engineer, structural: More than 4 times per year     Marital Status: Patient declined   Intimate Partner Violence: Not At Risk (12/11/2023)    Received from [employer] [facility] [employer], Afraid, Rape, and Kick questionnaire     Fear of Current or Ex-Partner: No     Emotionally Abused: No     Physically Abused: No     Sexually Abused: No   Housing Stability: Low Risk  (03/04/2024)    Housing Stability Vital Sign     Unable to Pay for Housing in the Last Year: No     Number of Times Moved in the Last Year: 0     Homeless in the Last Year: No       SCREENINGS                         Glasgow Coma Scale  Eye Opening: Spontaneous  Best Verbal  Response: Oriented  Best Motor Response: Obeys commands  Glasgow Coma Scale Score: 15                     CIWA Assessment  BP: 137/88  Pulse: 64                 PHYSICAL EXAM       ED Triage Vitals [03/06/24 1657]   BP Systolic BP Percentile Diastolic BP Percentile Temp Temp Source Pulse Respirations SpO2   137/88 -- -- 98.4 F (36.9 C) Oral 71 16 100 %      Height Weight - Scale         1.778 m (5' 10) 87.5 kg (193 lb)             Physical Exam  Vitals and nursing note reviewed.   Constitutional:       General: He is not in acute distress.     Appearance: He is well-developed. He is not toxic-appearing or diaphoretic.   HENT:      Head: Normocephalic and atraumatic.      Right Ear: External ear normal.      Left Ear: External ear normal.      Nose: Nose normal.      Mouth/Throat:      Mouth: Mucous membranes are moist.      Pharynx: Oropharynx is clear.   Eyes:      General: No scleral icterus.        Right eye: No discharge.         Left eye: No discharge.      Extraocular Movements: Extraocular movements intact.      Conjunctiva/sclera: Conjunctivae normal.   Cardiovascular:      Rate and Rhythm: Normal rate and regular rhythm.      Pulses: Normal pulses.      Heart sounds: Normal heart sounds.  Pulmonary:      Effort: Pulmonary effort is normal. No respiratory distress.      Breath sounds: Normal breath sounds.   Abdominal:      General: Bowel sounds are normal. There is no distension.      Palpations: Abdomen is soft. There is no mass (no mass or pulsitile mass).      Tenderness: There is no abdominal tenderness. There is no guarding.   Musculoskeletal:         General: No signs of injury. Normal range of motion.      Cervical back: Normal range of motion and neck supple.      Right lower leg: No edema.      Left lower leg: No edema.   Skin:     General: Skin is warm and dry.      Capillary Refill: Capillary refill takes less than 2 seconds.   Neurological:      General: No focal deficit present.      Mental  Status: He is alert and oriented to person, place, and time.   Psychiatric:         Mood and Affect: Mood normal.         Behavior: Behavior normal.         DIAGNOSTIC RESULTS     EKG: All EKG's are interpreted by the Emergency Department Physician who either signs or Co-signs this chart in the absence of a cardiologist.        RADIOLOGY:   Non-plain film images such as CT, Ultrasound and MRI are read by the radiologist. Plain radiographic images are visualized and preliminarily interpreted by the emergency physician with the below findings:        Interpretation per the Radiologist below, if available at the time of this note:    XR CHEST (2 VW)   Final Result   No radiographic evidence of acute cardiopulmonary abnormality.               ED BEDSIDE ULTRASOUND:   Performed by ED Physician - none    LABS:  Labs Reviewed - No data to display    All other labs were within normal range or not returned as of this dictation.    EMERGENCY DEPARTMENT COURSE and DIFFERENTIAL DIAGNOSIS/MDM:   Vitals:    Vitals:    03/06/24 1715 03/06/24 1730 03/06/24 1800 03/06/24 1830   BP:       Pulse: 72 74 67 64   Resp: 17 13 13     Temp:       TempSrc:       SpO2: 100%      Weight:       Height:           Based on patient's presenting symptoms, physical exam medical history will have the nurse reach out to the house supervisor for Holter monitor.    Medical Decision Making  Amount and/or Complexity of Data Reviewed  Radiology: ordered.  ECG/medicine tests: ordered.            REASSESSMENT     ED Course as of 03/06/24 1844   Sat Mar 06, 2024   1828 House supervisor came and evaluated the Holter monitor and indeed have no replacement nor any battery to exchange given the fact that this is a different Holter monitor because it was placed at [facility].  The house supervisor recommended the patient go to [facility] to replace either the battery or the entire  Holter monitor. [VL]   1842 Her supervisor with complaints that the patient had  replacement of her battery to exchange.  Patient states that at [facility] initially gave him a Holter monitor and would have the appropriate replacement.  Patient states that he went to [facility] before coming here and said that that they did not have replacements for those and the patient should be discharged at home.  Obviously incorrect.  Had discussion with the patient encouraged him to even since back to house supervisor in regards to see if they can find a replacement if not follow-up with his cardiologist on Monday and return to emergency department if any concerning symptoms arise.  Patient is agreeable with this plan. [TS]      ED Course User Index  [TS] Arzella Gee, APRN - CNP  [VL] Wiliam Berwyn SAUNDERS, DO         CRITICAL CARE TIME       CONSULTS:  None    PROCEDURES:  Unless otherwise noted below, none     Procedures        FINAL IMPRESSION      1. History of Holter monitoring          DISPOSITION/PLAN   DISPOSITION Discharge - Pending Orders Complete 03/06/2024 05:45:18 PM   DISPOSITION CONDITION Stable           PATIENT REFERRED [place]  [address]  [place] [address]  [phone]          [facility] Emergency Department  [phone]  [place]  [id number]  [phone]          DISCHARGE MEDICATIONS:  New Prescriptions    No medications on file     Controlled Substances Monitoring:          No data to display                (Please note that portions of this note were completed with a voice recognition program.  Efforts were made to edit the dictations but occasionally words are mis-transcribed.)    Gee Arzella, APRN - CNP (electronically signed)         Arzella Gee, APRN - CNP  03/06/24 1844

## 2024-03-06 NOTE — Discharge Instructions (Addendum)
 Return to the emergency department if symptoms worsen or persist.    Return back to [facility] for a Holter monitor replacement and or a battery replacement.  This may need to wait until Monday.  Continue with your post discharge instructions per cardiology.

## 2024-03-06 NOTE — ED Provider Notes (Signed)
 [facility] Emergency Department  [phone]  [facility] [place] [address]  Phone: [phone]  Fax: [phone]      Attending Physician Attestation    I performed a history and physical examination of the patient and discussed management with the mid level provider. I reviewed the mid level provider's note and agree with the documented findings and plan of care. Any areas of disagreement are noted on the chart. I was personally present for the key portions of any procedures. I have documented in the chart those procedures where I was not present during the key portions. I have reviewed the emergency nurses triage note. I agree with the chief complaint, past medical history, past surgical history, allergies, medications, social and family history as documented unless otherwise noted below. Documentation of the HPI, Physical Exam and Medical Decision Making performed by mid level providers is based on my personal performance of the HPI, PE and MDM. For Physician Assistant/ Nurse Practitioner cases/documentation I have personally evaluated this patient and have completed at least one if not all key elements of the E/M (history, physical exam, and MDM). Additional findings are as noted.      CHIEF COMPLAINT       Chief Complaint   Patient presents with    Tachycardia     My hearts beating fast since last night; also has holter monitor, its dead; hospitalized 3 days ago for syncope/collapse         HISTORY OF PRESENT ILLNESS    Luis Santiago Santiago is a 53 y.o. male who presents needing battery for his holter monitor.   The patient stated was placed yesterday per the recommendation Dr. Hildegard and his cardiologist who saw him while he was hospitalized on 723 for syncope at [facility].  The Holter monitor was placed here at [facility] yesterday and the battery worked up until 11 AM this morning.  He was told to come to the emergency department for reevaluation.  Patient has no additional or new  symptoms.  He is just concerned because the Holter monitor read dead.  He was under the impression that something more serious was wrong.  He would not of come to the emergency department if it was not for the fact that his Holter monitor battery died.  Consult Orders   Consult to Cardiology [[id number]] ordered by Connye Suzen Munroe, MD at 03/03/24 2337          Expand All Collapse All    Attestation signed by       Attending Physician Statement:     I have discussed the care of  Luis Santiago Santiago , including pertinent history and exam findings, with the Cardiology fellow/resident.      I have seen and examined the patient and the key elements of all parts of the encounter have been performed by me. I agree with the assessment, plan and orders as documented by the fellow/resident, after I modified exam findings and plan of treatments, and the final version is my approved version of the assessment.      Additional Comments: Syncope. EMS noticed BP into 90/50. He takes lisinopril . DC lisinopril . Monitor BP at home.  Liberalize oral fluid intake.  Compression socks knee-high and off while sleeping.  Recent echocardiogram and stress test were unremarkable.  Recommend outpatient Holter monitoring for 7 days.     Leni Wenona Hildegard, MD                   PAST MEDICAL  HISTORY    has a past medical history of Hypertension.    SURGICAL HISTORY      has no past surgical history on file.    CURRENT MEDICATIONS       Discharge Medication List as of 03/06/2024  6:56 PM        CONTINUE these medications which have NOT CHANGED    Details   lisinopril  (PRINIVIL ;ZESTRIL ) 5 MG tablet Take 0.5 tablets by mouth daily Only take this medication when your systolic blood pressure (the top number) is 140 or higher., Disp-15 tablet, R-0Normal             ALLERGIES     is allergic to dilantin [phenytoin].    FAMILY HISTORY     has no family status information on file.      family history is not on file.    SOCIAL HISTORY      reports that he  has never smoked. He has never used smokeless tobacco. He reports that he does not use drugs.    PHYSICAL EXAM     INITIAL VITALS:  height is 1.778 m (5' 10) and weight is 87.5 kg (193 lb). His oral temperature is 98.4 F (36.9 C). His blood pressure is 144/88 (abnormal) and his pulse is 68. His respiration is 18 and oxygen saturation is 95%.            DIAGNOSTIC RESULTS   The EKG was read interpreted by myself showing normal sinus rhythm ventricular rate of 62 PR interval 146 QRS 90 QTc 442 normal axis and intervals as listed.  1 isolated T wave inversion in V1.  No other acute ST or T wave changes of concern.  Comparison to March 03, 2024 showed a prolonged QTc at 497.  This appears to have improved.  Otherwise no new or acute changes of concern.  RADIOLOGY:   Non-plain film images such as CT, Ultrasound and MRI are read by the radiologist. Plain radiographic images are visualized and the radiologist interpretations are reviewed as follows:     XR CHEST (2 VW)   Final Result   No radiographic evidence of acute cardiopulmonary abnormality.             LABS:  Results for orders placed or performed during the hospital encounter of 03/06/24   EKG 12 Lead   Result Value Ref Range    Ventricular Rate 62 BPM    Atrial Rate 62 BPM    P-R Interval 146 ms    QRS Duration 90 ms    Q-T Interval 436 ms    QTc Calculation (Bazett) 442 ms    P Axis 47 degrees    R Axis 25 degrees    T Axis 56 degrees           EMERGENCY DEPARTMENT COURSE:   Vitals:    Vitals:    03/06/24 1730 03/06/24 1800 03/06/24 1830 03/06/24 1858   BP:    (!) 144/88   Pulse: 74 67 64 68   Resp: 13 13  18    Temp:       TempSrc:       SpO2:    95%   Weight:       Height:         -------------------------  BP: (!) 144/88, Temp: 98.4 F (36.9 C), Pulse: 68, Respirations: 18      PERTINENT ATTENDING PHYSICIAN COMMENTS:    This is a nice 53 year old African-American male that was  personally seen and evaluated by myself in conjunction with Luis Santiago Santiago the nurse  practitioner.  The patient presented here with concerns of a dead Holter monitor battery.    Old records were reviewed from his recent hospitalization [facility] and his discharge per Dr. Hildegard cardiology to have a Holter monitor for 7 days.  He was just placed yesterday here at [facility].  He would not of come to the emergency department for was not for the fact that his battery died this morning at 11 AM.  We are working on trying to replace his battery.  There is no indication for additional testing workup or imaging today.  The patient feels very comfortable with this plan.     ED Course as of 03/11/24 9187   Sat Mar 06, 2024   1828 House supervisor came and evaluated the Holter monitor and indeed have no replacement nor any battery to exchange given the fact that this is a different Holter monitor because it was placed at [facility].  The house supervisor recommended the patient go to [facility] to replace either the battery or the entire Holter monitor. [VL]   1842 Her supervisor with complaints that the patient had replacement of her battery to exchange.  Patient states that at [facility] initially gave him a Holter monitor and would have the appropriate replacement.  Patient states that he went to [facility] before coming here and said that that they did not have replacements for those and the patient should be discharged at home.  Obviously incorrect.  Had discussion with the patient encouraged him to even since back to house supervisor in regards to see if they can find a replacement if not follow-up with his cardiologist on Monday and return to emergency department if any concerning symptoms arise.  Patient is agreeable with this plan. [TS]      ED Course User Index  [TS] Arzella Norman, APRN - CNP  [VL] Wiliam Berwyn SAUNDERS, DO       (Please note that portions of this note were completed with a voice recognition program.  Efforts were made to edit the dictations but occasionally words are  mis-transcribed.)    Berwyn SAUNDERS Wiliam, DO  Board Certified Emergency Medicine Physician       Wiliam Berwyn SAUNDERS, DO  03/11/24 [phone]

## 2024-03-06 NOTE — ED Notes (Signed)
 Attempts made in ER to have battery changed , however this facility does not carry the correct holter monitor batteries. Pt will check in with cardiology for replacement.

## 2024-03-07 LAB — EKG 12-LEAD
Atrial Rate: 62 {beats}/min
P Axis: 47 degrees
P-R Interval: 146 ms
Q-T Interval: 436 ms
QRS Duration: 90 ms
QTc Calculation (Bazett): 442 ms
R Axis: 25 degrees
T Axis: 56 degrees
Ventricular Rate: 62 {beats}/min

## 2024-03-09 NOTE — Telephone Encounter (Signed)
 Pt walking in with concerns with his holter   On Friday he took the holter off because he was thinking the holter had died due to know green light. Check holter confirm holter wasn't dead Santina though the instructions with him again. Clean site and put holter back on

## 2024-03-09 NOTE — Telephone Encounter (Signed)
 Left message to return call to schedule hospital f.u apt

## 2024-03-09 NOTE — Telephone Encounter (Signed)
 Error

## 2024-03-12 NOTE — Telephone Encounter (Signed)
 Cherry holter returned and downloaded

## 2024-03-16 ENCOUNTER — Encounter

## 2024-03-16 LAB — CARDIAC HOLTER MONITOR: Body Surface Area: 2.08 m2

## 2024-03-16 NOTE — Telephone Encounter (Signed)
-----   Message from RN Graeme SAILOR, RN sent at 03/16/2024 12:15 PM EDT -----    ----- Message -----  From: Hildegard Leni Silvers, MD  Sent: 03/16/2024  11:59 AM EDT  To: [facility] Nursing Staff    Unremarkable. Patient to liberalize po fluid intake. Arrange for follow up with NP

## 2024-03-16 NOTE — Telephone Encounter (Signed)
 Left message on pts VM to contact office  (re Dr Jud holter results order)

## 2024-03-17 NOTE — Telephone Encounter (Signed)
 Pt came in office, reviewed echo and Dr Jud echo results order.  Pt agrees and v/u and will keep 04/09/24 appt with NP.

## 2024-03-17 NOTE — Telephone Encounter (Signed)
 Left message on pts VM to contact office  (re Dr Jud holter results order)

## 2024-03-17 NOTE — Telephone Encounter (Signed)
-----   Message from RN Graeme SAILOR, RN sent at 03/16/2024 12:15 PM EDT -----    ----- Message -----  From: Hildegard Leni Silvers, MD  Sent: 03/16/2024  11:59 AM EDT  To: [facility] Nursing Staff    Unremarkable. Patient to liberalize po fluid intake. Arrange for follow up with NP

## 2024-03-18 NOTE — Telephone Encounter (Signed)
 See other encounter.

## 2024-03-18 NOTE — Telephone Encounter (Signed)
 Holter

## 2024-04-09 ENCOUNTER — Encounter: Payer: BLUE CROSS/BLUE SHIELD | Attending: Registered Nurse

## 2024-04-19 ENCOUNTER — Inpatient Hospital Stay
Admit: 2024-04-19 | Discharge: 2024-04-19 | Disposition: A | Discharge status: Home / Self Care | Payer: BLUE CROSS/BLUE SHIELD | Arrived: WI | Attending: Emergency Medicine

## 2024-04-19 ENCOUNTER — Emergency Department: Admit: 2024-04-19 | Payer: BLUE CROSS/BLUE SHIELD

## 2024-04-19 DIAGNOSIS — I159 Secondary hypertension, unspecified: Principal | ICD-10-CM

## 2024-04-19 LAB — CBC WITH AUTO DIFFERENTIAL
Basophils %: 0 % (ref 0–2)
Basophils Absolute: <0.03 k/uL (ref 0.00–0.20)
Eosinophils %: 5 % — ABNORMAL HIGH (ref 1–4)
Eosinophils Absolute: 0.15 k/uL (ref 0.00–0.44)
Hematocrit: 37.9 % — ABNORMAL LOW (ref 40.7–50.3)
Hemoglobin: 12.6 g/dL — ABNORMAL LOW (ref 13.0–17.0)
Immature Granulocytes %: 0 %
Immature Granulocytes Absolute: <0.03 k/uL (ref 0.00–0.30)
Lymphocytes %: 45 % — ABNORMAL HIGH (ref 24–43)
Lymphocytes Absolute: 1.35 k/uL (ref 1.10–3.70)
MCH: 31.3 pg (ref 25.2–33.5)
MCHC: 33.2 g/dL (ref 28.4–34.8)
MCV: 94 fL (ref 82.6–102.9)
MPV: 10 fL (ref 8.1–13.5)
Monocytes %: 16 % — ABNORMAL HIGH (ref 3–12)
Monocytes Absolute: 0.49 k/uL (ref 0.10–1.20)
NRBC Automated: 0 /100{WBCs}
Neutrophils %: 34 % — ABNORMAL LOW (ref 36–65)
Neutrophils Absolute: 1.02 k/uL — ABNORMAL LOW (ref 1.50–8.10)
Platelets: 225 k/uL (ref 138–453)
RBC: 4.03 m/uL — ABNORMAL LOW (ref 4.21–5.77)
RDW: 13 % (ref 11.8–14.4)
WBC: 3 k/uL — ABNORMAL LOW (ref 3.5–11.3)

## 2024-04-19 LAB — BASIC METABOLIC PANEL
Anion Gap: 11 mmol/L (ref 9–16)
BUN: 9 mg/dL (ref 6–20)
CO2: 26 mmol/L (ref 20–31)
Calcium: 8.8 mg/dL (ref 8.6–10.4)
Chloride: 102 mmol/L (ref 98–107)
Creatinine: 1 mg/dL (ref 0.7–1.2)
Est, Glom Filt Rate: 89 mL/min/1.73m2 (ref >60)
Glucose: 104 mg/dL — ABNORMAL HIGH (ref 74–99)
Potassium: 3.8 mmol/L (ref 3.7–5.3)
Sodium: 139 mmol/L (ref 136–145)

## 2024-04-19 LAB — TROPONIN
Troponin, High Sensitivity: <6 ng/L (ref 0–22)
Troponin, High Sensitivity: <6 ng/L (ref 0–22)

## 2024-04-19 MED ORDER — LISINOPRIL 10 MG PO TABS
10 | Freq: Every day | ORAL | Status: DC
Start: 2024-04-19 — End: 2024-04-19
  Administered 2024-04-19: 20:00:00 10 mg via ORAL

## 2024-04-19 MED ORDER — KETOROLAC TROMETHAMINE 15 MG/ML IJ SOLN
15 | Freq: Once | INTRAMUSCULAR | Status: AC
Start: 2024-04-19 — End: 2024-04-19
  Administered 2024-04-19: 20:00:00 15 mg via INTRAVENOUS

## 2024-04-19 MED FILL — KETOROLAC TROMETHAMINE 15 MG/ML IJ SOLN: 15 mg/mL | INTRAMUSCULAR | Qty: 1 | Fill #0

## 2024-04-19 MED FILL — LISINOPRIL 10 MG PO TABS: 10 mg | ORAL | Qty: 1 | Fill #0

## 2024-04-19 NOTE — Discharge Instructions (Addendum)
 You are seen in the emergency room today for high blood pressure.  He also reported chest pain as well as a headache.  We performed an ECG, chest x-ray, as well as blood work.  At this time, we believe that you are medically stable for discharge.  We recommend that you follow-up with your primary care physician as well as your cardiologist at your upcoming appointment.  Please return to the ER with any vision changes, severe headache, chest pain/tightness, nausea/vomiting.

## 2024-04-19 NOTE — ED Provider Notes (Signed)
 FACULTY SIGN-OUT  ADDENDUM       Patient: Luis Santiago   MRN: [id number]  PCP:  Quin Dagoberto BIRCH, APRN - NP  Note Started: 04/19/24, 4:12 PM EDT  Attestation  I was available and discussed any additional care issues that arose and coordinated the management plans with the resident(s) caring for the patient during my duty period. Any areas of disagreement with resident's documentation of care or procedures are noted on the chart. I was personally present for the key portions of any/all procedures during my duty period. I have documented in the chart those procedures where I was not present during the key portions.   The patient's initial evaluation and plan have been discussed with the prior provider who initially evaluated the patient.      Pertinent Comments:  The patient is a 53 y.o. male taken in signout with history of hypertension found at outpatient office to be 170 systolic and asked to come to the emergency room  Since finding out his blood pressure has started to have some slight headache and chest discomfort  We are awaiting workup and reevaluation after    ED COURSE      The patient was given the following medications:  Orders Placed This Encounter   Medications    lisinopril  (PRINIVIL ;ZESTRIL ) tablet 10 mg    ketorolac  (TORADOL ) injection 15 mg       RECENT VITALS:   BP: (!) 193/101  Pulse: 62  Respirations: (!) 9  Temp: 98.1 F (36.7 C) SpO2: 100 %    (Please note that portions of this note were completed with a voice recognition program.  Efforts were made to edit the dictations but occasionally words are mis-transcribed.)    Beverley CHRISTELLA Manifold, MD [facility]  Attending Emergency Medicine Physician       Manifold Beverley CHRISTELLA, MD  04/19/24 [phone]

## 2024-04-19 NOTE — ED Triage Notes (Signed)
 Patient arrived to ED w/ c/o hypertension, BP taken at dentist and sent here, chest pain and SOB.  Patient states he has seen doctor for elevated BP, has lisinopril  PRN, doesn't take it or check BP at home.  Patient states chest pain and SOB started 30 min PTA. Has had chest pain before, never was seen.   Patient states no PMH other than HTN.   Patient resting comfortably in bed, RR even/unlabored, call light in reach.

## 2024-04-19 NOTE — ED Provider Notes (Signed)
 [place] EMERGENCY DEPARTMENT  Emergency Department Encounter  Emergency Medicine Resident     Pt Name:Luis Santiago  MRN: [id number]  Birthdate 1971-03-18  Date of evaluation: 04/19/24  PCP:  Quin Dagoberto BIRCH, APRN - NP  Note Started: 5:42 PM EDT      CHIEF COMPLAINT       Chief Complaint   Patient presents with    Hypertension    Headache    Chest Pain       HISTORY OF PRESENT ILLNESS  (Location/Symptom, Timing/Onset, Context/Setting, Quality, Duration, Modifying Factors, Severity.)      Aulden Calise is a 53 y.o. male who presents with concerns for hypertension.  Patient was reportedly at his dentist earlier today, who reported that the patient had high blood pressure.  Patient subsequently reports that he pulled over, and became concerned when he was reading online about the complications associated with high blood pressure.  At this time, patient does endorse a headache as well as nonradiating midsternal chest pain.  Patient denies any previous episodes of this.  He denies any vision changes, loss of consciousness, back pain.    PAST MEDICAL / SURGICAL / SOCIAL / FAMILY HISTORY      has a past medical history of Hypertension.     has no past surgical history on file.    Social History     Socioeconomic History    Marital status: Single     Spouse name: Not on file    Number of children: Not on file    Years of education: Not on file    Highest education level: Not on file   Occupational History    Not on file   Tobacco Use    Smoking status: Never    Smokeless tobacco: Never   Vaping Use    Vaping status: Never Used   Substance and Sexual Activity    Alcohol use: Yes     Comment: socially    Drug use: Never    Sexual activity: Not on file   Other Topics Concern    Not on file   Social History Narrative    Not on file     Social Drivers of Health     Financial Resource Strain: Low Risk  (12/11/2023)    Received from [facility]    Overall [employer] (CARDIA)     Difficulty of  Paying Living Expenses: Not hard at all   Food Insecurity: No Food Insecurity (03/04/2024)    Hunger Vital Sign     Worried About Running Out of Food in the Last Year: Never true     Ran Out of Food in the Last Year: Never true   Transportation Needs: No Transportation Needs (03/04/2024)    PRAPARE - Therapist, art (Medical): No     Lack of Transportation (Non-Medical): No   Physical Activity: Sufficiently Active (12/11/2023)    [facility]    Exercise Vital Sign     On average, how many days per week do you engage in moderate to strenuous exercise (like a brisk walk)?: 6 days     On average, how many minutes do you engage in exercise at this level?: 40 min   Stress: No Stress Concern Present (12/11/2023)    [facility] from [facility]ational Health - Occupational Stress Questionnaire     Feeling of Stress :  Only a little   Social Connections: Unknown (12/11/2023)    Received from [facility] [employer] and Isolation Panel     In a typical week, how many times do you talk on the phone with family, friends, or neighbors?: Once a week     How often do you get together with friends or relatives?: Once a week     How often do you attend church or religious services?: 1 to 4 times per year     Do you belong to any clubs or organizations such as church groups, unions, fraternal or athletic groups, or school groups?: Yes     How often do you attend meetings of the clubs or organizations you belong to?: More than 4 times per year     Are you married, widowed, divorced, separated, never married, or living with a partner?: Patient declined   Intimate Partner Violence: Not At Risk (03/27/2024)    Received from [employer] [facility] [employer], Afraid, Rape, and Kick questionnaire     Within the last year, have you been afraid of your partner or ex-partner?: No     Within the last year, have you been humiliated or  emotionally abused in other ways by your partner or ex-partner?: No     Within the last year, have you been kicked, hit, slapped, or otherwise physically hurt by your partner or ex-partner?: No     Within the last year, have you been raped or forced to have any kind of sexual activity by your partner or ex-partner?: No   Housing Stability: Low Risk  (03/04/2024)    Housing Stability Vital Sign     Unable to Pay for Housing in the Last Year: No     Number of Times Moved in the Last Year: 0     Homeless in the Last Year: No       History reviewed. No pertinent family history.    Allergies:  Dilantin [phenytoin]    Home Medications:  Prior to Admission medications   Medication Sig Start Date End Date Taking? Authorizing Provider   lisinopril  (PRINIVIL ;ZESTRIL ) 5 MG tablet Take 0.5 tablets by mouth daily Only take this medication when your systolic blood pressure (the top number) is 140 or higher. 03/04/24 04/03/24  Germaine Silvano HERO, MD       REVIEW OF SYSTEMS       Review of Systems   Reason unable to perform ROS: See HPI.       PHYSICAL EXAM      INITIAL VITALS:   BP (!) 169/81   Pulse 68   Temp 98.1 F (36.7 C) (Oral)   Resp 12   SpO2 100%     Physical Exam  Constitutional:       Appearance: Normal appearance.   HENT:      Head: Normocephalic.      Nose: Nose normal.      Mouth/Throat:      Mouth: Mucous membranes are moist.   Eyes:      Pupils: Pupils are equal, round, and reactive to light.   Cardiovascular:      Rate and Rhythm: Normal rate and regular rhythm.      Pulses: Normal pulses.   Pulmonary:      Effort: Pulmonary effort is normal.   Abdominal:      General: Abdomen is flat.   Musculoskeletal:         General:  Normal range of motion.   Skin:     General: Skin is warm.      Capillary Refill: Capillary refill takes less than 2 seconds.   Neurological:      General: No focal deficit present.      Mental Status: He is alert and oriented to person, place, and time.           DDX/DIAGNOSTIC RESULTS /  EMERGENCY DEPARTMENT COURSE / MDM     Medical Decision Making  Amount and/or Complexity of Data Reviewed  Labs: ordered.  Radiology: ordered.    Risk  Prescription drug management.    Patient is a 53 year old male who presents out of concern for hypertension.  Patient had reportedly been advised by his dentist to present to the ER after a blood pressure reading of systolic in the 170s.  Of note, patient denies any vision changes, nausea/vomiting, loss of consciousness.  He does endorse a headache as well as nonradiating midsternal chest pain.  Patient has reportedly been on lisinopril  in the past, which he reports he is not presently taking.  Patient reports that he does have outpatient follow-up scheduled with cardiology within the next few days.  Physical exam shows normal pupillary reflex bilaterally, heart sounds are within normal limits, lung sounds are within normal limits, there is no abdominal pain or tenderness.  Will plan for CBC, metabolic panel, troponins, ECG, chest x-ray.  Patient's ECG was unconcerning for ischemia, patient's troponins were negative.  Patient's metabolic panel was noteworthy for leukopenia.  Advised patient regarding following up with his primary care physician for further management.  Patient understands and agrees with plan, he has been provided with return precautions.    EKG  Normal sinus rhythm, no signs of ischemia, no axis deviation.    All EKG's are interpreted by the Emergency Department Physician who either signs or Co-signs this chart in the absence of a cardiologist.    EMERGENCY DEPARTMENT COURSE:  XR CHEST (2 VW)  Result Date: 04/19/2024  EXAM: 2 VIEW(S) XRAY OF THE CHEST 04/19/2024 03:46:13 PM COMPARISON: 03/06/2024 CLINICAL HISTORY: CP. CP; TECHNOLOGIST PROVIDED HISTORY: CP FINDINGS: LUNGS AND PLEURA: No focal pulmonary opacity. No pulmonary edema. No pleural effusion. No pneumothorax. HEART AND MEDIASTINUM: No acute abnormality of the cardiac and mediastinal  silhouettes. BONES AND SOFT TISSUES: No acute osseous abnormality.     1. No acute process.     Labs Reviewed   CBC WITH AUTO DIFFERENTIAL - Abnormal; Notable for the following components:       Result Value    WBC 3.0 (*)     RBC 4.03 (*)     Hemoglobin 12.6 (*)     Hematocrit 37.9 (*)     Neutrophils % 34 (*)     Lymphocytes % 45 (*)     Monocytes % 16 (*)     Eosinophils % 5 (*)     Neutrophils Absolute 1.02 (*)     All other components within normal limits   BASIC METABOLIC PANEL - Abnormal; Notable for the following components:    Glucose 104 (*)     All other components within normal limits   TROPONIN   TROPONIN              PROCEDURES:    CONSULTS:  None    CRITICAL CARE:  There was significant risk of life threatening deterioration of patient's condition requiring my direct management. Critical care time 0 minutes, excluding any documented procedures.  FINAL IMPRESSION      1. Secondary hypertension    2. Nonintractable headache, unspecified chronicity pattern, unspecified headache type            DISPOSITION / PLAN     DISPOSITION Decision To Discharge 04/19/2024 05:32:33 PM           PATIENT REFERRED TO:  Kathlen Melena, MD  [address]  [address]  [place] [address]  [phone]    Call in 1 day        DISCHARGE MEDICATIONS:  Discharge Medication List as of 04/19/2024  5:33 PM          Leonor KATHEE Lyell, MD  Emergency Medicine Resident    (Please note that portions of this note were completed with a voice recognition program.  Efforts were made to edit the dictations but occasionally words are mis-transcribed.)

## 2024-04-19 NOTE — Plan of Care (Signed)
 I signed up for this patient in error. I did not see this patient. I did not participate in the evlauation or treatment of this patient.    Electronically signed by Bertrum Ellen, DO on 04/19/2024 at 3:15 PM

## 2024-04-19 NOTE — ED Provider Notes (Signed)
 [facility]     Emergency Department     Faculty Attestation    I performed a history and physical examination of the patient and discussed management with the resident. I have reviewed and agree with the resident's findings including all diagnostic interpretations, and treatment plans as written at the time of my review. Any areas of disagreement are noted on the chart. I was personally present for the key portions of any procedures. I have documented in the chart those procedures where I was not present during the key portions. For Physician Assistant/ Nurse Practitioner cases/documentation I have personally evaluated this patient and have completed at least one if not all key elements of the E/M (history, physical exam, and MDM). Additional findings are as noted.    PtNameTerryon Santiago  MRN: [id number]  Birthdate 1970/11/09  Date of evaluation: 04/19/24  Note Started: 3:51 PM EDT    Primary Care Physician: Quin Dagoberto BIRCH, APRN - NP    Brief HPI:  He is a 53 year old male presents emergency department with hypertension, mild headache, chest pain.  The patient reports he was at a dentist office systolic blood pressure was noted to be 170.  He reports that afterward he started to experience mild frontal headache, mild chest pain.  He therefore presented to the emergency department.  He has history of hypertension.  He is not taking his lisinopril  as prescribed.    Pertinent Physical Exam Findings:  Vitals:    04/19/24 1520   BP: (!) 193/103   Pulse: 74   Resp: 12   Temp:    SpO2: 100%   Appears well, resting comfortably, no acute distress, regular rate and rhythm, breathing is even and unlabored, grossly nonfocal neurologic exam.    Medical Decision Making: Patient is a 53 y.o. male presenting to the emergency department with hypertension. The chart was reviewed for pertinent history relating to the chief complaint.  The patient appears well at this time in no acute  distress, he is severely hypertensive, blood pressure is 193/103.  Plan to obtain CBC, BMP, troponin.  Twelve-lead EKG reveals normal sinus rhythm, no specific signs of acute ischemia, intervals are within normal limits, axis normal.  The patient was given lisinopril  10 mg p.o. per his home medication.     All results, including labs (if ordered), imaging (if ordered), and EKGs (if ordered) were independently interpreted by me.  See radiologist report for additional details on imaging studies.      (Please note that portions of this note were completed with a voice recognition program.  Efforts were made to edit the dictations but occasionally words are mis-transcribed.)    Fairy Labrum, DO, [facility]  Attending Emergency Medicine Physician         Labrum Fairy, DO  04/19/24 1552

## 2024-04-20 LAB — EKG 12-LEAD
Atrial Rate: 63 {beats}/min
P Axis: 65 degrees
P-R Interval: 144 ms
Q-T Interval: 442 ms
QRS Duration: 92 ms
QTc Calculation (Bazett): 452 ms
R Axis: 51 degrees
T Axis: 73 degrees
Ventricular Rate: 63 {beats}/min

## 2024-04-21 ENCOUNTER — Ambulatory Visit: Admit: 2024-04-21 | Discharge: 2024-04-21 | Payer: BLUE CROSS/BLUE SHIELD

## 2024-04-21 VITALS — BP 140/92 | HR 66 | Resp 16 | Ht 72.0 in | Wt 190.0 lb

## 2024-04-21 DIAGNOSIS — R55 Syncope and collapse: Principal | ICD-10-CM

## 2024-04-21 MED ORDER — AMLODIPINE BESYLATE 5 MG PO TABS
5 | ORAL_TABLET | Freq: Every day | ORAL | 3 refills | 60.00000 days | Status: DC
Start: 2024-04-21 — End: 2024-07-21

## 2024-04-21 NOTE — Progress Notes (Signed)
 [facility]  Consultation/Follow Up.    Luis Santiago  [birth year]  [id number]    Today: 04/21/24    CC: Patient is here for   Chief Complaint   Patient presents with    Follow-up        HPI:   Luis Santiago    53 year old male with past medical history of hypertension, recently had a echocardiogram and stress test at [facility], stress test was negative for reversible ischemia, transthoracic echo showed normal EF without significant valvular abnormalities, patient blood pressure is uncontrolled, patient is having episodes of headache sometimes chest pain because of elevated blood pressure, he visited emergency department yesterday also found to have blood pressure 193/110 mmHg.  He is taking losartan 50 mg daily.  Today's blood pressure is 140/92 mmHg.  Patient occasionally smokes cigars.  Patient denies using drugs  He denies chest pain, shortness of breath, orthopnea, PND, lower leg edema, episodes of palpitation or heart flushing or abdominal pain.  EKG showed normal sinus rhythm without significant ST-T wave changes.         is doing well from a cardiac standpoint. Good functional capacity with no significant change in functional capacity. No chest pain, no dyspnea, no PND, no syncope or pre-syncope, no orthopnea. No symptoms of CHF or angina/chest pain. Here for follow up regarding the above chronic stable cardiovascular conditions.    Past Medical:  Past Medical History:   Diagnosis Date    Hypertension          Past Surgical:  No past surgical history on file.      Family History:  No family history on file.    Social History:  Social History     Socioeconomic History    Marital status: Single     Spouse name: Not on file    Number of children: Not on file    Years of education: Not on file    Highest education level: Not on file   Occupational History    Not on file   Tobacco Use    Smoking status: Never    Smokeless tobacco: Never   Vaping Use    Vaping status: Never Used   Substance and Sexual  Activity    Alcohol use: Yes     Comment: socially    Drug use: Never    Sexual activity: Not on file   Other Topics Concern    Not on file   Social History Narrative    Not on file     Social Drivers of Health     Financial Resource Strain: Low Risk  (12/11/2023)    Received from [facility]    Overall [employer] (CARDIA)     Difficulty of Paying Living Expenses: Not hard at all   Food Insecurity: No Food Insecurity (03/04/2024)    Hunger Vital Sign     Worried About Running Out of Food in the Last Year: Never true     Ran Out of Food in the Last Year: Never true   Transportation Needs: No Transportation Needs (03/04/2024)    PRAPARE - Therapist, art (Medical): No     Lack of Transportation (Non-Medical): No   Physical Activity: Sufficiently Active (12/11/2023)    [facility] from [facility]    Exercise Vital Sign     On average, how many days per week do you engage in moderate to strenuous exercise (like a brisk  walk)?: 6 days     On average, how many minutes do you engage in exercise at this level?: 40 min   Stress: No Stress Concern Present (12/11/2023)    [facility] from [facility] Occupational Health - Occupational Stress Questionnaire     Feeling of Stress : Only a little   Social Connections: Unknown (12/11/2023)    Received from [facility] [employer] and Isolation Panel     In a typical week, how many times do you talk on the phone with family, friends, or neighbors?: Once a week     How often do you get together with friends or relatives?: Once a week     How often do you attend church or religious services?: 1 to 4 times per year     Do you belong to any clubs or organizations such as church groups, unions, fraternal or athletic groups, or school groups?: Yes     How often do you attend meetings of the clubs or organizations you belong to?: More than 4 times per year     Are you married,  widowed, divorced, separated, never married, or living with a partner?: Patient declined   Intimate Partner Violence: Not At Risk (03/27/2024)    Received from [employer] [facility] [employer], Afraid, Rape, and Kick questionnaire     Within the last year, have you been afraid of your partner or ex-partner?: No     Within the last year, have you been humiliated or emotionally abused in other ways by your partner or ex-partner?: No     Within the last year, have you been kicked, hit, slapped, or otherwise physically hurt by your partner or ex-partner?: No     Within the last year, have you been raped or forced to have any kind of sexual activity by your partner or ex-partner?: No   Housing Stability: Low Risk  (03/04/2024)    Housing Stability Vital Sign     Unable to Pay for Housing in the Last Year: No     Number of Times Moved in the Last Year: 0     Homeless in the Last Year: No        REVIEW OF SYSTEMS:    Constitutional: there has been no unanticipated weight loss. There's been No change in energy level, No change in activity level.     Eyes: No visual changes or diplopia. No scleral icterus.  ENT: No Headaches, hearing loss or vertigo. No mouth sores or sore throat.  Cardiovascular: AS HPI  Respiratory: AS HPI  Gastrointestinal: No abdominal pain, appetite loss, blood in stools. No change in bowel or bladder habits.  Genitourinary: No dysuria, trouble voiding, or hematuria.  Musculoskeletal:  No gait disturbance, No weakness or joint complaints.  Integumentary: No rash or pruritis.  Neurological: No headache, diplopia, change in muscle strength, numbness or tingling. No change in gait, balance, coordination, mood, affect, memory, mentation, behavior.  Psychiatric: No new anxiety or depression.  Endocrine: No temperature intolerance. No excessive thirst, fluid intake, or urination. No tremor.  Hematologic/Lymphatic: No abnormal bruising or bleeding, blood clots or swollen lymph  nodes.  Allergic/Immunologic: No nasal congestion or hives.    Medications:  No current outpatient medications on file.     Physical Exam:   Vitals: BP (!) 140/92   Pulse 66   Resp 16   Ht 1.829 m (6')  Wt 86.2 kg (190 lb)   SpO2 99%   BMI 25.77 kg/m   General appearance: alert and cooperative with exam  HEENT: Head: Normocephalic, no lesions, without obvious abnormality.  Neck: no carotid bruit, no JVD  Lungs: clear to auscultation bilaterally  Heart:  regular rate and rhythm, S1, S2 normal, no Murmur  Abdomen: soft, non-tender; bowel sounds normal; no masses,  no organomegaly  Extremities: no site injection hematoma, extremities normal, atraumatic, no cyanosis. no edema  Neurologic: Mental status: Alert, oriented, thought content appropriate    Labs:  No results found for: CHOL, TRIG, HDL, LDL, VLDL, CHOLHDLRATIO     Lab Results   Component Value Date    NA 139 04/19/2024    K 3.8 04/19/2024    CL 102 04/19/2024    CO2 26 04/19/2024    BUN 9 04/19/2024    CREATININE 1.0 04/19/2024    GLUCOSE 104 (H) 04/19/2024    CALCIUM 8.8 04/19/2024    BILITOT 0.3 03/04/2024    ALKPHOS 91 03/04/2024    AST 26 03/04/2024    ALT 43 03/04/2024    LABGLOM 89 04/19/2024       EKG:   Results for orders placed or performed during the hospital encounter of 04/19/24   EKG 12 Lead   Result Value Ref Range    Ventricular Rate 63 BPM    Atrial Rate 63 BPM    P-R Interval 144 ms    QRS Duration 92 ms    Q-T Interval 442 ms    QTc Calculation (Bazett) 452 ms    P Axis 65 degrees    R Axis 51 degrees    T Axis 73 degrees    Narrative    Normal sinus rhythm  Minimal voltage criteria for LVH, may be normal variant  Borderline ECG  When compared with ECG of 06-Mar-2024 17:11,  No significant change was found       Echo:    No results found for this or any previous visit.    No results found for this or any previous visit.      No results found for this or any previous visit.    No results found for this or any previous  visit.    No results found for this or any previous visit.    No valid procedures specified.  No results found for this or any previous visit.    No results found for this or any previous visit.    No results found for this or any previous visit.    No results found for this or any previous visit.    No results found for this or any previous visit.    No results found for this or any previous visit.      Stress Test:    No results found for this or any previous visit.     No results found for this or any previous visit.       LAST CATH:   No results found for this or any previous visit.    No results found for this or any previous visit.       Past Medical and Surgical History, Problem List, Allergies, Medications, Labs, Imaging, all reviewed extensively in EMR and with the patient.    Assessment:  Hypertension, elevated  Smokes cigars  Episodes of headache due to high blood pressure    Plan:  Continue losartan 50 mg daily and start the patient on amlodipine   5 mg daily, patient advised to check the blood pressure at home and keep log of it if his blood pressure is high we will increase the dose of losartan and amlodipine .  Patient was in emergency department that he got lisinopril  but he is not taking the lisinopril  at home.  Patient advised not to take salty food and do regular exercise  Patient counseled for do not smoke cigars  Follow-up 3 months    The patient was consulted on signs and symptoms of CVD/CHF/ACS and notified when to call office. The patient is to continue heart healthy diet, weight loss and exercise as tolerated. Patient's medications and side effects were discussed. Medication refills were provided if needed. Follow up appointment timing was discussed. All questions and concerns were addressed to patient's satisfaction.     No follow-ups on file.     Thank you for allowing me to participate in the care of this patient, please do not hesitate to call if you have any questions.    Claudia Headings, MD    [facility]  [email or web address]  [phone]    This note was created with the assistance of a speech-recognition program.  Although the intention is to generate a document that actually reflects the content of the visit, no guarantees can be provided that every mistake has been identified and corrected by editing.

## 2024-07-21 ENCOUNTER — Ambulatory Visit: Admit: 2024-07-21 | Discharge: 2024-07-21 | Payer: BLUE CROSS/BLUE SHIELD | Attending: Family

## 2024-07-21 VITALS — BP 130/82 | HR 71 | Resp 16 | Ht 66.0 in | Wt 189.0 lb

## 2024-07-21 DIAGNOSIS — I10 Essential (primary) hypertension: Principal | ICD-10-CM

## 2024-07-21 MED ORDER — AMLODIPINE BESYLATE 5 MG PO TABS
5 | ORAL_TABLET | Freq: Every day | ORAL | 4 refills | Status: AC
Start: 2024-07-21 — End: ?

## 2024-07-21 NOTE — Progress Notes (Signed)
 "  [facility]  Consultation/Follow Up.    Luis Santiago  1970/09/20  [id number]    Today: 07/15/24    CC: Patient is here for f/u syncope & BP       HPI:   Luis Santiago    53 year old male with past medical history of hypertension, recently had a echocardiogram and stress test at [facility], stress test was negative for reversible ischemia, transthoracic echo showed normal EF without significant valvular abnormalities, patient blood pressure is uncontrolled at times.      Seen & examined in room today alone. He denies chest pain, shortness of breath, orthopnea, PND, lower leg edema, episodes of palpitation or heart flushing or abdominal pain.  He denies any further dizziness  Today's blood pressure is 140/92 mmHg.  Patient occasionally smokes cigars.  Patient denies using drugs  Compliant with the Norvasc     Past Medical:  Past Medical History:   Diagnosis Date    Hypertension          Past Surgical:  No past surgical history on file.      Family History:  No family history on file.    Social History:  Social History     Socioeconomic History    Marital status: Single     Spouse name: Not on file    Number of children: Not on file    Years of education: Not on file    Highest education level: Not on file   Occupational History    Not on file   Tobacco Use    Smoking status: Never    Smokeless tobacco: Never   Vaping Use    Vaping status: Never Used   Substance and Sexual Activity    Alcohol use: Yes     Comment: socially    Drug use: Never    Sexual activity: Not on file   Other Topics Concern    Not on file   Social History Narrative    Not on file     Social Drivers of Health     Financial Resource Strain: Low Risk  (12/11/2023)    Received from [facility]    Overall [employer] (CARDIA)     Difficulty of Paying Living Expenses: Not hard at all   Food Insecurity: No Food Insecurity (03/04/2024)    Hunger Vital Sign     Worried About Running Out of Food in the Last Year: Never  true     Ran Out of Food in the Last Year: Never true   Transportation Needs: No Transportation Needs (03/04/2024)    PRAPARE - Therapist, Art (Medical): No     Lack of Transportation (Non-Medical): No   Physical Activity: Sufficiently Active (12/11/2023)    Received from [facility]    Exercise Vital Sign     On average, how many days per week do you engage in moderate to strenuous exercise (like a brisk walk)?: 6 days     On average, how many minutes do you engage in exercise at this level?: 40 min   Stress: No Stress Concern Present (12/11/2023)    [facility] from [facility] - Occupational Stress Questionnaire     Feeling of Stress : Only a little   Social Connections: Unknown (12/11/2023)    Received from [facility] [place] and Isolation Panel  In a typical week, how many times do you talk on the phone with family, friends, or neighbors?: Once a week     How often do you get together with friends or relatives?: Once a week     How often do you attend church or religious services?: 1 to 4 times per year     Do you belong to any clubs or organizations such as church groups, unions, fraternal or athletic groups, or school groups?: Yes     How often do you attend meetings of the clubs or organizations you belong to?: More than 4 times per year     Are you married, widowed, divorced, separated, never married, or living with a partner?: Patient declined   Intimate Partner Violence: Not At Risk (03/27/2024)    Received from [facility] [employer], Afraid, Rape, and Kick questionnaire     Within the last year, have you been afraid of your partner or ex-partner?: No     Within the last year, have you been humiliated or emotionally abused in other ways by your partner or ex-partner?: No     Within the last year, have you been kicked, hit, slapped, or otherwise physically hurt by your  partner or ex-partner?: No     Within the last year, have you been raped or forced to have any kind of sexual activity by your partner or ex-partner?: No   Housing Stability: Low Risk  (03/04/2024)    Housing Stability Vital Sign     Unable to Pay for Housing in the Last Year: No     Number of Times Moved in the Last Year: 0     Homeless in the Last Year: No        REVIEW OF SYSTEMS:    Constitutional: there has been no unanticipated weight loss. There's been No change in energy level, No change in activity level.     Eyes: No visual changes or diplopia. No scleral icterus.  ENT: No Headaches, hearing loss or vertigo. No mouth sores or sore throat.  Cardiovascular: AS HPI  Respiratory: AS HPI  Gastrointestinal: No abdominal pain, appetite loss, blood in stools. No change in bowel or bladder habits.  Genitourinary: No dysuria, trouble voiding, or hematuria.  Musculoskeletal:  No gait disturbance, No weakness or joint complaints.  Integumentary: No rash or pruritis.  Neurological: No headache, diplopia, change in muscle strength, numbness or tingling. No change in gait, balance, coordination, mood, affect, memory, mentation, behavior.  Psychiatric: No new anxiety or depression.  Endocrine: No temperature intolerance. No excessive thirst, fluid intake, or urination. No tremor.  Hematologic/Lymphatic: No abnormal bruising or bleeding, blood clots or swollen lymph nodes.  Allergic/Immunologic: No nasal congestion or hives.    Medications:    Current Outpatient Medications:     amLODIPine  (NORVASC ) 5 MG tablet, Take 1 tablet by mouth daily, Disp: 90 tablet, Rfl: 3     Physical Exam:   Vitals: There were no vitals taken for this visit.  General appearance: alert and cooperative with exam  HEENT: Head: Normocephalic, no lesions, without obvious abnormality.  Neck: no carotid bruit, no JVD  Lungs: clear to auscultation bilaterally  Heart:  regular rate and rhythm, S1, S2 normal, no Murmur  Abdomen: soft, non-tender; bowel  sounds normal; no masses,  no organomegaly  Extremities: no site injection hematoma, extremities normal, atraumatic, no cyanosis. no edema  Neurologic: Mental status: Alert, oriented, thought content appropriate    Labs:  No results found for: CHOL, TRIG, HDL, LDL, VLDL, CHOLHDLRATIO     Lab Results   Component Value Date    NA 139 04/19/2024    K 3.8 04/19/2024    CL 102 04/19/2024    CO2 26 04/19/2024    BUN 9 04/19/2024    CREATININE 1.0 04/19/2024    GLUCOSE 104 (H) 04/19/2024    CALCIUM 8.8 04/19/2024    BILITOT 0.3 03/04/2024    ALKPHOS 91 03/04/2024    AST 26 03/04/2024    ALT 43 03/04/2024    LABGLOM 89 04/19/2024       EKG 07/21/24 NSR 71    Echo 08/2023  Conclusions Left Ventricle: The left ventricle is normal size.     Global left ventricular systolic function is at lower limits of     normal. EF range is estimated at 50 % -55 %. Left ventricular wall     thickness is normal. No regional wall motion abnormality. Right     Ventricle: The right ventricle is normal in size. Normal right     ventricular systolic function. Doppler studies suggest normal right     sided pressures. Left Atrium: The left atrium is normal in size.       Stress Test 08/2023  Conclusions Negative perfusion     stress test for ischemia, Normal myocardial perfusion with soft tissue     artifact, Normal global left ventricular function, No transient     ischemia dilatation. Normal treadmill ECG stress test and The [facility]     Score 9 consistent with low risk estimates an annual cardiovascular     mortality of 0% and a five year survival of 95% Using the Duke Score     there is a low probability of any angiographic coronary disease.     Past Medical and Surgical History, Problem List, Allergies, Medications, Labs, Imaging, all reviewed extensively in EMR and with the patient.    Assessment:  Hypertension, elevated  Smokes cigars  Episodes of headache due to high blood pressure    Plan:  Bp now well controlled. States PCP took him  off the losartan bc of worsening dizziness  and now he is feeling better. Compliant with Norvasc . BP stable. Continue this along with ambulatory monitoring.   Discussed importance of med compiance, diet, exercise, and avoiding dehydration. Questions/concerns addressed and verb understanding  Echo and stress test from Jan reviewed and low risk.  Patient counseled on tobacco/cigar abuse cessation. Smoking cessation counseling exceeded 3 minutes but did not exceed 10 minutes. Questions/concerns addressed.    Follow-up 6 months    The patient was consulted on signs and symptoms of CVD/CHF/ACS and notified when to call office. The patient is to continue heart healthy diet, weight loss and exercise as tolerated. Patient's medications and side effects were discussed. Medication refills were provided if needed. Follow up appointment timing was discussed. All questions and concerns were addressed to patient's satisfaction.       Thank you for allowing me to participate in the care of this patient, please do not hesitate to call if you have any questions.    Suzen JINNY Malm, APRN - CNP   [facility]  [email or web address]  [phone]    "
